# Patient Record
Sex: Male | Born: 1985 | Race: White | Hispanic: No | Marital: Single | Smoking: Current every day smoker
Health system: Southern US, Community
[De-identification: ages and names within clinical notes are randomized; demographics above are authoritative.]

## PROBLEM LIST (undated history)

## (undated) DIAGNOSIS — K859 Acute pancreatitis without necrosis or infection, unspecified: Secondary | ICD-10-CM

## (undated) DIAGNOSIS — F1010 Alcohol abuse, uncomplicated: Secondary | ICD-10-CM

## (undated) DIAGNOSIS — B1920 Unspecified viral hepatitis C without hepatic coma: Secondary | ICD-10-CM

## (undated) DIAGNOSIS — M67919 Unspecified disorder of synovium and tendon, unspecified shoulder: Secondary | ICD-10-CM

## (undated) DIAGNOSIS — Z720 Tobacco use: Secondary | ICD-10-CM

## (undated) HISTORY — PX: NO PAST SURGERIES: SHX2092

## (undated) HISTORY — PX: CHOLECYSTECTOMY: SHX55

## (undated) HISTORY — PX: WISDOM TOOTH EXTRACTION: SHX21

---

## 2009-04-17 ENCOUNTER — Emergency Department (HOSPITAL_COMMUNITY): Admission: EM | Admit: 2009-04-17 | Discharge: 2009-04-18 | Payer: Self-pay | Admitting: Emergency Medicine

## 2010-07-27 ENCOUNTER — Emergency Department (HOSPITAL_COMMUNITY)
Admission: EM | Admit: 2010-07-27 | Discharge: 2010-07-27 | Payer: Self-pay | Source: Home / Self Care | Admitting: Emergency Medicine

## 2010-10-31 LAB — BASIC METABOLIC PANEL
CO2: 26 mEq/L (ref 19–32)
Calcium: 9.6 mg/dL (ref 8.4–10.5)
Chloride: 102 mEq/L (ref 96–112)
Creatinine, Ser: 1.06 mg/dL (ref 0.4–1.5)
GFR calc Af Amer: >60 mL/min (ref >60)
Sodium: 137 mEq/L (ref 135–145)

## 2010-10-31 LAB — RAPID URINE DRUG SCREEN, HOSP PERFORMED
Amphetamines: NOT DETECTED
Barbiturates: NOT DETECTED
Benzodiazepines: POSITIVE — AB

## 2010-10-31 LAB — DIFFERENTIAL
Lymphs Abs: 3.4 10*3/uL (ref 0.7–4.0)
Monocytes Absolute: 0.9 10*3/uL (ref 0.1–1.0)
Monocytes Relative: 10 % (ref 3–12)
Neutro Abs: 5.1 10*3/uL (ref 1.7–7.7)
Neutrophils Relative %: 54 % (ref 43–77)

## 2010-10-31 LAB — CBC
Hemoglobin: 15.3 g/dL (ref 13.0–17.0)
RBC: 4.74 MIL/uL (ref 4.22–5.81)
WBC: 9.6 10*3/uL (ref 4.0–10.5)

## 2010-10-31 LAB — ETHANOL: Alcohol, Ethyl (B): <5 mg/dL (ref 0–10)

## 2010-12-14 ENCOUNTER — Emergency Department (HOSPITAL_COMMUNITY): Payer: Self-pay

## 2010-12-14 ENCOUNTER — Emergency Department (HOSPITAL_COMMUNITY)
Admission: EM | Admit: 2010-12-14 | Discharge: 2010-12-14 | Disposition: A | Discharge status: Home / Self Care | Payer: Self-pay | Attending: Emergency Medicine | Admitting: Emergency Medicine

## 2010-12-14 DIAGNOSIS — R110 Nausea: Secondary | ICD-10-CM | POA: Insufficient documentation

## 2010-12-14 DIAGNOSIS — R599 Enlarged lymph nodes, unspecified: Secondary | ICD-10-CM | POA: Insufficient documentation

## 2010-12-14 DIAGNOSIS — N509 Disorder of male genital organs, unspecified: Secondary | ICD-10-CM | POA: Insufficient documentation

## 2010-12-14 LAB — URINALYSIS, ROUTINE W REFLEX MICROSCOPIC
Bilirubin Urine: NEGATIVE
Glucose, UA: NEGATIVE mg/dL
Hgb urine dipstick: NEGATIVE
Specific Gravity, Urine: 1.013 (ref 1.005–1.030)
Urobilinogen, UA: 1 mg/dL (ref 0.0–1.0)
pH: 7 (ref 5.0–8.0)

## 2010-12-29 ENCOUNTER — Emergency Department (HOSPITAL_COMMUNITY)
Admission: EM | Admit: 2010-12-29 | Discharge: 2010-12-29 | Discharge status: Against Medical Advice | Payer: Self-pay | Attending: Emergency Medicine | Admitting: Emergency Medicine

## 2010-12-29 ENCOUNTER — Emergency Department (HOSPITAL_COMMUNITY): Payer: Self-pay

## 2010-12-29 DIAGNOSIS — Z0389 Encounter for observation for other suspected diseases and conditions ruled out: Secondary | ICD-10-CM | POA: Insufficient documentation

## 2011-06-24 ENCOUNTER — Encounter: Payer: Self-pay | Admitting: *Deleted

## 2011-06-24 ENCOUNTER — Emergency Department (HOSPITAL_COMMUNITY)
Admission: EM | Admit: 2011-06-24 | Discharge: 2011-06-24 | Disposition: A | Discharge status: Home / Self Care | Payer: Self-pay | Attending: Emergency Medicine | Admitting: Emergency Medicine

## 2011-06-24 DIAGNOSIS — F1210 Cannabis abuse, uncomplicated: Secondary | ICD-10-CM | POA: Insufficient documentation

## 2011-06-24 DIAGNOSIS — F17200 Nicotine dependence, unspecified, uncomplicated: Secondary | ICD-10-CM | POA: Insufficient documentation

## 2011-06-24 DIAGNOSIS — F1020 Alcohol dependence, uncomplicated: Secondary | ICD-10-CM | POA: Insufficient documentation

## 2011-06-24 DIAGNOSIS — R630 Anorexia: Secondary | ICD-10-CM | POA: Insufficient documentation

## 2011-06-24 DIAGNOSIS — R110 Nausea: Secondary | ICD-10-CM | POA: Insufficient documentation

## 2011-06-24 DIAGNOSIS — R10817 Generalized abdominal tenderness: Secondary | ICD-10-CM | POA: Insufficient documentation

## 2011-06-24 DIAGNOSIS — K029 Dental caries, unspecified: Secondary | ICD-10-CM | POA: Insufficient documentation

## 2011-06-24 HISTORY — DX: Alcohol abuse, uncomplicated: F10.10

## 2011-06-24 LAB — DIFFERENTIAL
Eosinophils Relative: 0 % (ref 0–5)
Lymphocytes Relative: 36 % (ref 12–46)
Lymphs Abs: 1.6 10*3/uL (ref 0.7–4.0)
Monocytes Relative: 12 % (ref 3–12)

## 2011-06-24 LAB — HEPATIC FUNCTION PANEL
Albumin: 4.9 g/dL (ref 3.5–5.2)
Bilirubin, Direct: 0.2 mg/dL (ref 0.0–0.3)
Total Bilirubin: 0.6 mg/dL (ref 0.3–1.2)

## 2011-06-24 LAB — CBC
HCT: 47.9 % (ref 39.0–52.0)
Hemoglobin: 16.8 g/dL (ref 13.0–17.0)
MCV: 94.3 fL (ref 78.0–100.0)
Platelets: 183 10*3/uL (ref 150–400)
RBC: 5.08 MIL/uL (ref 4.22–5.81)
WBC: 4.5 10*3/uL (ref 4.0–10.5)

## 2011-06-24 LAB — BASIC METABOLIC PANEL
BUN: 9 mg/dL (ref 6–23)
CO2: 23 mEq/L (ref 19–32)
Calcium: 9.8 mg/dL (ref 8.4–10.5)
Glucose, Bld: 83 mg/dL (ref 70–99)
Sodium: 138 mEq/L (ref 135–145)

## 2011-06-24 LAB — RAPID URINE DRUG SCREEN, HOSP PERFORMED
Amphetamines: NOT DETECTED
Cocaine: NOT DETECTED
Opiates: NOT DETECTED
Tetrahydrocannabinol: POSITIVE — AB

## 2011-06-24 MED ORDER — HYDROCODONE-ACETAMINOPHEN 5-325 MG PO TABS
1.0000 | ORAL_TABLET | Freq: Once | ORAL | Status: AC
  Administered 2011-06-24: 1 via ORAL
  Filled 2011-06-24: qty 1

## 2011-06-24 MED ORDER — LORAZEPAM 1 MG PO TABS
1.0000 mg | ORAL_TABLET | Freq: Once | ORAL | Status: AC
  Administered 2011-06-24: 1 mg via ORAL
  Filled 2011-06-24: qty 1

## 2011-06-24 MED ORDER — ACETAMINOPHEN 325 MG PO TABS
650.0000 mg | ORAL_TABLET | Freq: Once | ORAL | Status: AC
  Administered 2011-06-24: 650 mg via ORAL
  Filled 2011-06-24: qty 2

## 2011-06-24 NOTE — ED Provider Notes (Signed)
History     CSN: [id number] Arrival date & time: 06/24/2011  8:37 AM   First MD Initiated Contact with Patient 06/24/11 [phone]      Chief Complaint  Patient presents with  . Medical Clearance    (Consider location/radiation/quality/duration/timing/severity/associated sxs/prior treatment) HPI Comments: Patient comes to Ed requesting assistance for detox from alcohol.  States that he has hx of narcotic abuse and detoxed from narcotics and began drinking beer daily shortly after.  Admits to avg 24 cans of beer per day.  Occassional marijuana use but denies cocaine, or other street drug use at this time.  Reports his last alcohol consumption was 1:00 am this morning  drank two beers.  He denies previous in patient admission for alcohol detox. Also denies hx of DT's  Patient is a 25 y.o. male presenting with alcohol problem. The history is provided by the patient.  Alcohol Problem This is a chronic problem. The current episode started more than 1 year ago. The problem occurs constantly. The problem has been unchanged. Associated symptoms include anorexia and nausea. Pertinent negatives include no abdominal pain, arthralgias, chest pain, chills, coughing, fatigue, fever, headaches, joint swelling, myalgias, neck pain, numbness, rash, sore throat, swollen glands, urinary symptoms, vomiting or weakness. Associated symptoms comments: Dental pain. The symptoms are aggravated by nothing. He has tried nothing for the symptoms. The treatment provided no relief.    Past Medical History  Diagnosis Date  . Alcohol abuse     History reviewed. No pertinent past surgical history.  History reviewed. No pertinent family history.  History  Substance Use Topics  . Smoking status: Current Everyday Smoker -- 2.0 packs/day  . Smokeless tobacco: Not on file  . Alcohol Use: 0.0 oz/week    12-24 Cans of beer per week     daily      Review of Systems  Constitutional: Positive for appetite change. Negative  for fever, chills and fatigue.  HENT: Negative for sore throat, trouble swallowing, neck pain and neck stiffness.   Respiratory: Negative for cough, chest tightness, shortness of breath and wheezing.   Cardiovascular: Negative for chest pain and palpitations.  Gastrointestinal: Positive for nausea and anorexia. Negative for vomiting, abdominal pain, blood in stool and abdominal distention.  Genitourinary: Negative for dysuria, hematuria and flank pain.  Musculoskeletal: Negative for myalgias, back pain, joint swelling and arthralgias.  Skin: Negative.  Negative for rash.  Neurological: Negative for dizziness, tremors, weakness, numbness and headaches.  Hematological: Does not bruise/bleed easily.  All other systems reviewed and are negative.    Allergies  Review of patient's allergies indicates no known allergies.  Home Medications   Current Outpatient Rx  Name Route Sig Dispense Refill  . ACETAMINOPHEN 325 MG PO TABS Oral Take 650 mg by mouth every 6 (six) hours as needed. pain       BP 158/98  Pulse 109  Temp(Src) 98 F (36.7 C) (Oral)  Resp 18  Ht 5\' 11"  (1.803 m)  Wt 180 lb (81.647 kg)  BMI 25.10 kg/m2  SpO2 98%  Physical Exam  Nursing note and vitals reviewed. Constitutional: He is oriented to person, place, and time. He appears well-developed and well-nourished. No distress.       Appears anxious, smells of ETOH  HENT:  Head: Normocephalic and atraumatic. No trismus in the jaw.  Mouth/Throat: Uvula is midline, oropharynx is clear and moist and mucous membranes are normal. Dental caries present. No uvula swelling.  Eyes: EOM are normal. Pupils are equal,  round, and reactive to light.  Neck: Normal range of motion. Neck supple.  Cardiovascular: Normal rate, regular rhythm and normal heart sounds.   Pulmonary/Chest: Effort normal and breath sounds normal. No respiratory distress. He exhibits no tenderness.  Abdominal: Soft. Bowel sounds are normal. He exhibits no  distension and no mass. There is no hepatosplenomegaly. There is generalized tenderness. There is no rigidity, no rebound, no guarding and no CVA tenderness.  Musculoskeletal: Normal range of motion. He exhibits no edema and no tenderness.  Lymphadenopathy:    He has no cervical adenopathy.  Neurological: He is alert and oriented to person, place, and time. No cranial nerve deficit. He exhibits normal muscle tone. Coordination normal.  Skin: Skin is warm and dry.  Psychiatric: His speech is normal. Thought content normal. His mood appears anxious. He is not agitated, not actively hallucinating and not combative.    ED Course  Procedures (including critical care time)  Labs Reviewed  BASIC METABOLIC PANEL - Abnormal; Notable for the following:    Potassium 3.4 (*)    Chloride 95 (*)    All other components within normal limits  ETHANOL - Abnormal; Notable for the following:    Alcohol, Ethyl (B) 170 (*)    All other components within normal limits  URINE RAPID DRUG SCREEN (HOSP PERFORMED) - Abnormal; Notable for the following:    Tetrahydrocannabinol POSITIVE (*)    All other components within normal limits  HEPATIC FUNCTION PANEL - Abnormal; Notable for the following:    Total Protein 8.5 (*)    AST 123 (*)    ALT 162 (*)    All other components within normal limits  CBC  DIFFERENTIAL        MDM     0930 AM  Patient is alert, cooperative, appears anxious.  Smells of ETOH.  Frances Maywood of ACT team has been consulted will evaluate pt in the ED.    1130AM  ACT team counselor to arrange placement into a detox program.   2:41 PM counselor has arranged admission at [facility] in [place].  Pt to be transported by taxi    Maude Gloor L. Cecilee Rosner, [place] 06/24/11 1442

## 2011-06-24 NOTE — ED Notes (Signed)
Pt is here today to detox from alcohol. Pt states he has been drinking daily since May. Pt states he is now having generalized discomfort.
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
## 2011-06-24 NOTE — ED Provider Notes (Signed)
Medical screening examination/treatment/procedure(s) were performed by non-physician practitioner and as supervising physician I was immediately available for consultation/collaboration.   Benny Lennert, MD 06/24/11 1550

## 2011-06-27 DIAGNOSIS — K859 Acute pancreatitis without necrosis or infection, unspecified: Secondary | ICD-10-CM

## 2011-06-27 HISTORY — DX: Acute pancreatitis without necrosis or infection, unspecified: K85.90

## 2011-07-23 ENCOUNTER — Encounter (HOSPITAL_COMMUNITY): Payer: Self-pay | Admitting: Emergency Medicine

## 2011-07-23 ENCOUNTER — Inpatient Hospital Stay (HOSPITAL_COMMUNITY)
Admission: EM | Admit: 2011-07-23 | Discharge: 2011-07-28 | DRG: 440 | Disposition: A | Discharge status: Home / Self Care | Payer: Self-pay | Attending: Internal Medicine | Admitting: Internal Medicine

## 2011-07-23 ENCOUNTER — Emergency Department (HOSPITAL_COMMUNITY): Payer: Self-pay

## 2011-07-23 DIAGNOSIS — R000 Tachycardia, unspecified: Secondary | ICD-10-CM | POA: Diagnosis present

## 2011-07-23 DIAGNOSIS — F1010 Alcohol abuse, uncomplicated: Secondary | ICD-10-CM | POA: Diagnosis present

## 2011-07-23 DIAGNOSIS — D72829 Elevated white blood cell count, unspecified: Secondary | ICD-10-CM | POA: Diagnosis present

## 2011-07-23 DIAGNOSIS — F1020 Alcohol dependence, uncomplicated: Secondary | ICD-10-CM | POA: Diagnosis present

## 2011-07-23 DIAGNOSIS — R1110 Vomiting, unspecified: Secondary | ICD-10-CM | POA: Diagnosis present

## 2011-07-23 DIAGNOSIS — F17200 Nicotine dependence, unspecified, uncomplicated: Secondary | ICD-10-CM | POA: Diagnosis present

## 2011-07-23 DIAGNOSIS — R1013 Epigastric pain: Secondary | ICD-10-CM | POA: Diagnosis present

## 2011-07-23 DIAGNOSIS — E860 Dehydration: Secondary | ICD-10-CM | POA: Diagnosis present

## 2011-07-23 DIAGNOSIS — F1210 Cannabis abuse, uncomplicated: Secondary | ICD-10-CM | POA: Diagnosis present

## 2011-07-23 DIAGNOSIS — K859 Acute pancreatitis without necrosis or infection, unspecified: Principal | ICD-10-CM | POA: Diagnosis present

## 2011-07-23 HISTORY — DX: Tobacco use: Z72.0

## 2011-07-23 HISTORY — DX: Acute pancreatitis without necrosis or infection, unspecified: K85.90

## 2011-07-23 HISTORY — DX: Unspecified disorder of synovium and tendon, unspecified shoulder: M67.919

## 2011-07-23 LAB — COMPREHENSIVE METABOLIC PANEL
AST: 27 U/L (ref 0–37)
Alkaline Phosphatase: 51 U/L (ref 39–117)
BUN: 13 mg/dL (ref 6–23)
CO2: 24 mEq/L (ref 19–32)
Chloride: 97 mEq/L (ref 96–112)
Creatinine, Ser: 0.78 mg/dL (ref 0.50–1.35)
GFR calc non Af Amer: >90 mL/min (ref >90)
Potassium: 3.8 mEq/L (ref 3.5–5.1)
Total Bilirubin: 0.3 mg/dL (ref 0.3–1.2)

## 2011-07-23 LAB — URINE MICROSCOPIC-ADD ON

## 2011-07-23 LAB — URINALYSIS, ROUTINE W REFLEX MICROSCOPIC
Bilirubin Urine: NEGATIVE
Glucose, UA: NEGATIVE mg/dL
Ketones, ur: 15 mg/dL — AB
Leukocytes, UA: NEGATIVE
Protein, ur: 100 mg/dL — AB

## 2011-07-23 LAB — DIFFERENTIAL
Basophils Absolute: 0 10*3/uL (ref 0.0–0.1)
Lymphocytes Relative: 10 % — ABNORMAL LOW (ref 12–46)
Monocytes Absolute: 0.7 10*3/uL (ref 0.1–1.0)
Monocytes Relative: 5 % (ref 3–12)
Neutro Abs: 12.4 10*3/uL — ABNORMAL HIGH (ref 1.7–7.7)

## 2011-07-23 LAB — CBC
HCT: 46.2 % (ref 39.0–52.0)
Hemoglobin: 16.4 g/dL (ref 13.0–17.0)
RBC: 4.97 MIL/uL (ref 4.22–5.81)
WBC: 14.6 10*3/uL — ABNORMAL HIGH (ref 4.0–10.5)

## 2011-07-23 MED ORDER — HYDROMORPHONE HCL PF 1 MG/ML IJ SOLN
1.0000 mg | Freq: Once | INTRAMUSCULAR | Status: AC
  Administered 2011-07-23: 1 mg via INTRAVENOUS
  Filled 2011-07-23: qty 1

## 2011-07-23 MED ORDER — NICOTINE 21 MG/24HR TD PT24
21.0000 mg | MEDICATED_PATCH | Freq: Every day | TRANSDERMAL | Status: DC
  Administered 2011-07-24 – 2011-07-27 (×5): 21 mg via TRANSDERMAL
  Filled 2011-07-23 (×6): qty 1

## 2011-07-23 MED ORDER — LORAZEPAM 1 MG PO TABS
1.0000 mg | ORAL_TABLET | Freq: Four times a day (QID) | ORAL | Status: AC | PRN
  Administered 2011-07-23: 1 mg via ORAL
  Filled 2011-07-23: qty 1

## 2011-07-23 MED ORDER — FOLIC ACID 1 MG PO TABS
1.0000 mg | ORAL_TABLET | Freq: Every day | ORAL | Status: DC
  Administered 2011-07-23 – 2011-07-28 (×6): 1 mg via ORAL
  Filled 2011-07-23 (×6): qty 1

## 2011-07-23 MED ORDER — ONDANSETRON HCL 4 MG/2ML IJ SOLN
4.0000 mg | Freq: Once | INTRAMUSCULAR | Status: AC
  Administered 2011-07-23: 4 mg via INTRAVENOUS
  Filled 2011-07-23: qty 2

## 2011-07-23 MED ORDER — ONDANSETRON HCL 4 MG/2ML IJ SOLN
4.0000 mg | Freq: Four times a day (QID) | INTRAMUSCULAR | Status: DC
  Administered 2011-07-23: 4 mg via INTRAVENOUS
  Filled 2011-07-23: qty 2

## 2011-07-23 MED ORDER — HYDROMORPHONE HCL PF 1 MG/ML IJ SOLN
1.0000 mg | Freq: Once | INTRAMUSCULAR | Status: AC
Start: 2011-07-23 — End: 2011-07-23
  Administered 2011-07-23: 1 mg via INTRAVENOUS
  Filled 2011-07-23: qty 1

## 2011-07-23 MED ORDER — HYDROMORPHONE HCL PF 1 MG/ML IJ SOLN
1.0000 mg | INTRAMUSCULAR | Status: DC | PRN
  Filled 2011-07-23: qty 1

## 2011-07-23 MED ORDER — MORPHINE SULFATE 2 MG/ML IJ SOLN
2.0000 mg | INTRAMUSCULAR | Status: DC | PRN
  Filled 2011-07-23: qty 2

## 2011-07-23 MED ORDER — OXYCODONE HCL 5 MG PO TABS
5.0000 mg | ORAL_TABLET | ORAL | Status: DC | PRN
  Administered 2011-07-23 – 2011-07-28 (×26): 5 mg via ORAL
  Filled 2011-07-23 (×26): qty 1

## 2011-07-23 MED ORDER — FENTANYL CITRATE 0.05 MG/ML IJ SOLN
50.0000 ug | INTRAMUSCULAR | Status: AC
  Administered 2011-07-23: 50 ug via INTRAVENOUS
  Filled 2011-07-23: qty 2

## 2011-07-23 MED ORDER — ONDANSETRON HCL 4 MG PO TABS: 4.0000 mg | ORAL_TABLET | Freq: Four times a day (QID) | ORAL | Status: DC | PRN

## 2011-07-23 MED ORDER — LORAZEPAM 2 MG/ML IJ SOLN
1.0000 mg | Freq: Four times a day (QID) | INTRAMUSCULAR | Status: AC | PRN
  Administered 2011-07-25 (×3): 1 mg via INTRAVENOUS
  Administered 2011-07-26: 03:00:00 via INTRAVENOUS
  Filled 2011-07-23 (×4): qty 1

## 2011-07-23 MED ORDER — VITAMIN B-1 100 MG PO TABS
100.0000 mg | ORAL_TABLET | Freq: Every day | ORAL | Status: DC
  Administered 2011-07-23 – 2011-07-28 (×6): 100 mg via ORAL
  Filled 2011-07-23 (×6): qty 1

## 2011-07-23 MED ORDER — MORPHINE SULFATE 2 MG/ML IJ SOLN
2.0000 mg | INTRAMUSCULAR | Status: DC | PRN
  Administered 2011-07-23: 2 mg via INTRAVENOUS
  Administered 2011-07-23 (×2): 4 mg via INTRAVENOUS
  Administered 2011-07-23: 2 mg via INTRAVENOUS
  Administered 2011-07-23 – 2011-07-26 (×17): 4 mg via INTRAVENOUS
  Administered 2011-07-26: 2 mg via INTRAVENOUS
  Filled 2011-07-23 (×9): qty 2
  Filled 2011-07-23: qty 1
  Filled 2011-07-23 (×13): qty 2

## 2011-07-23 MED ORDER — ADULT MULTIVITAMIN W/MINERALS CH
1.0000 | ORAL_TABLET | Freq: Every day | ORAL | Status: DC
  Administered 2011-07-23 – 2011-07-28 (×6): 1 via ORAL
  Filled 2011-07-23 (×6): qty 1

## 2011-07-23 MED ORDER — SODIUM CHLORIDE 0.9 % IV SOLN
INTRAVENOUS | Status: DC
  Administered 2011-07-23 – 2011-07-26 (×6): via INTRAVENOUS

## 2011-07-23 MED ORDER — IOHEXOL 300 MG/ML  SOLN
100.0000 mL | Freq: Once | INTRAMUSCULAR | Status: AC | PRN
  Administered 2011-07-23: 100 mL via INTRAVENOUS

## 2011-07-23 MED ORDER — NICOTINE 21 MG/24HR TD PT24
21.0000 mg | MEDICATED_PATCH | Freq: Once | TRANSDERMAL | Status: AC
  Administered 2011-07-23: 21 mg via TRANSDERMAL
  Filled 2011-07-23: qty 1

## 2011-07-23 MED ORDER — SODIUM CHLORIDE 0.9 % IV SOLN
Freq: Once | INTRAVENOUS | Status: AC
  Administered 2011-07-23: 04:00:00 via INTRAVENOUS

## 2011-07-23 MED ORDER — ONDANSETRON HCL 4 MG/2ML IJ SOLN: 4.0000 mg | Freq: Four times a day (QID) | INTRAMUSCULAR | Status: DC | PRN

## 2011-07-23 MED ORDER — SODIUM CHLORIDE 0.9 % IV BOLUS (SEPSIS)
500.0000 mL | Freq: Once | INTRAVENOUS | Status: AC
  Administered 2011-07-23: 500 mL via INTRAVENOUS

## 2011-07-23 MED ORDER — THIAMINE HCL 100 MG/ML IJ SOLN
100.0000 mg | Freq: Every day | INTRAMUSCULAR | Status: DC
  Filled 2011-07-23 (×6): qty 1

## 2011-07-23 MED ORDER — MORPHINE SULFATE 4 MG/ML IJ SOLN
INTRAMUSCULAR | Status: AC
  Administered 2011-07-23: 4 mg
  Filled 2011-07-23: qty 1

## 2011-07-23 NOTE — Progress Notes (Signed)
Subjective: Chart reviewed. Patient complains of diffuse abdominal pain which is not significantly changed. Some back pain. No nausea or vomiting. Has tolerated sips of clears.  Objective: Blood pressure 157/93, pulse 73, temperature 98.5 F (36.9 C), temperature source Oral, resp. rate 20, height 5\' 11"  (1.803 m), weight 81.647 kg (180 lb), SpO2 97.00%.  Intake/Output Summary (Last 24 hours) at 07/23/11 1710 Last data filed at 07/23/11 1500  Gross per 24 hour  Intake   1000 ml  Output      0 ml  Net   1000 ml   General exam: Mild painful distress. Respiratory system: Clear. Cardiovascular system: first and second heart sounds heard, regular. Gastrointestinal system: Abdomen is nondistended, diffuse mild tenderness but soft. No rigidity guarding or rebound. Bowel sounds are normally heard. Central nervous system: Alert and oriented. No focal neurological deficits.  Lab Results: Basic Metabolic Panel:  [facility] 07/23/11 0210  NA 138  K 3.8  CL 97  CO2 24  GLUCOSE 111*  BUN 13  CREATININE 0.78  CALCIUM 9.6  MG --  PHOS --   Liver Function Tests:  Basename 07/23/11 0210  AST 27  ALT 41  ALKPHOS 51  BILITOT 0.3  PROT 8.3  ALBUMIN 4.8    Basename 07/23/11 0210  LIPASE 277*  AMYLASE --   No results found for this basename: AMMONIA:2 in the last 72 hours CBC:  Basename 07/23/11 0210  WBC 14.6*  NEUTROABS 12.4*  HGB 16.4  HCT 46.2  MCV 93.0  PLT 232   Urine Drug Screen: Drugs of Abuse     Component Value Date/Time   LABOPIA NONE DETECTED 06/24/2011 0912   COCAINSCRNUR NONE DETECTED 06/24/2011 0912   LABBENZ NONE DETECTED 06/24/2011 0912   AMPHETMU NONE DETECTED 06/24/2011 0912   THCU POSITIVE* 06/24/2011 0912   LABBARB NONE DETECTED 06/24/2011 0912    Alcohol Level: No results found for this basename: ETH:2 in the last 72 hours  Studies/Results: Ct Abdomen Pelvis W Contrast  07/23/2011  *RADIOLOGY REPORT*  Clinical Data: Mid abdominal pain,  elevated lipase and leukocytosis.  CT ABDOMEN AND PELVIS WITH CONTRAST    IMPRESSION:  1.  Suspect acute pancreatitis, with soft tissue inflammation about the head of the pancreas, and significant edema and fluid tracking inferiorly along the retroperitoneum and Gerota's fascia on the right side.  Edema extends to the level of the upper pelvis; the extent and distribution of edema is slightly unusual.  No evidence for pseudocyst formation or devascularization. 2.  No evidence for appendicitis.  Original Report Authenticated By: Tonia Ghent, M.D.    Medications: Scheduled Meds:   . sodium chloride   Intravenous Once  . fentaNYL  50 mcg Intravenous STAT  . folic acid  1 mg Oral Daily  .  HYDROmorphone (DILAUDID) injection  1 mg Intravenous Once  .  HYDROmorphone (DILAUDID) injection  1 mg Intravenous Once  .  HYDROmorphone (DILAUDID) injection  1 mg Intravenous Once  .  HYDROmorphone (DILAUDID) injection  1 mg Intravenous Once  . morphine      . mulitivitamin with minerals  1 tablet Oral Daily  . nicotine  21 mg Transdermal Once  . ondansetron (ZOFRAN) IV  4 mg Intravenous Once  . ondansetron (ZOFRAN) IV  4 mg Intravenous Once  . sodium chloride  500 mL Intravenous Once  . thiamine  100 mg Oral Daily   Or  . thiamine  100 mg Intravenous Daily  . DISCONTD: ondansetron (ZOFRAN) IV  4  mg Intravenous Q6H   Continuous Infusions:   . sodium chloride     PRN Meds:.iohexol, LORazepam, LORazepam, morphine, morphine injection, ondansetron (ZOFRAN) IV, ondansetron, oxyCODONE, DISCONTD:  HYDROmorphone (DILAUDID) injection  Assessment/Plan: 1. Acute pancreatitis, precipitated by alcohol intoxication: Continue clear liquids as tolerated and pain control. No indication for antibiotics at this time. 2. History of alcohol dependence: Has been through rehabilitation recently and had quit only to binge x1. No features of withdrawal. Will check CIWA. 3. Tobacco abuse 4. Polysubstance abuse: Marijuana:  Cessation counseled.   HONGALGI,ANAND 07/23/2011, 5:10 PM

## 2011-07-23 NOTE — H&P (Signed)
PCP:   No primary provider on file.   Doesn't have one  Chief Complaint:  Epigastric pain, nausea / vomiting  HPI: 25yoM with h/o narcotics abuse s/p detox, alcohol abuse s/p detox 06/2011, now comes back with  one night alcohol binge causing epigastric pain and vomiting, and found to have pancreatitis.   Pt was last seen in the ED 11/28 requesting detox from alcohol, stating that he had h/o  narcotics abuse and had detoxed from narcotics last year and started drinking beer instead,  averaging 24 cans per day, occasional marijuana use but denying other drugs. He was arranged to  be placed in a detox program, to [facility] in [place]. He did the detox and states he hasn't drank  alcohol since then until tonight, and things were actually going well such that he got a job at  [employer], and enrolled in college classes starting next semester. He moved in with his  mom as well, who he describes as a healthy "good [place] lady," as it appears that for most  of the year he was living with his dad, who he describes as a severe cirrhotic alcoholic who  doesn't eat for days and looks "like a skeleton" and he is suprised is still alive.   He abstained from alcohol until Christmas night when he went to his dad's and had drinks. He  drank about a fifth of liquor himself, but denies any other drug use. Subsequently develops  severe abdominal pain, nausea and vomiting, inability to tolerate water.   In the ED, vitals significant for tachycardia to 110. Chem panel normal, including renal  13/0.78, glucose 111. Lipase was 277, rest of LFT's normal. WBC was 14.6 with 85% neutros, rest  of CBC normal. UA showed 15 ketones, 100 protein, o/w normal. CTAP with contrast was done which  was suspect for pancreatitis, soft tissue inflammation around the head of pancreas, edema and  fluid tracking inferiorly along retroperitoneum and Gerota's fascia on the right, but nothing  more worrisome.      ROS as  above, o/w negative. Of note, he endorses h/o shakiness, palpitations, anxiety with alcohol withdrawal, but no seizures, hallucinations, comas, ICU admission.   Past Medical History  Diagnosis Date  . Alcohol abuse     Father is strongly alcoholic with cirrhosis. Rehab stay in 06/2011  . Rotator cuff disorder   . Pancreatitis 06/2011  . Tobacco abuse     History reviewed. No pertinent past surgical history.  Medications:  HOME MEDS:  Doesn't take any daily meds  Prior to Admission medications   Medication Sig Start Date End Date Taking? Authorizing Provider  acetaminophen (TYLENOL) 325 MG tablet Take 650 mg by mouth every 6 (six) hours as needed. pain    Yes Historical Provider, MD   Allergies:  No Known Allergies  Social History:   reports that he has been smoking.  He has quit using smokeless tobacco. He reports that he drinks alcohol. He reports that he uses illicit drugs (Marijuana). Living with his mom, in order to not live with his dad who he describes as daily severe drinker, with cirrhosis. Got a job at [employer] and enrolled at college after stopping drinking in rehab in 05/2011. Has insight into consequences of his alcohol abuse. Smokes 1/2-1 ppd for past 10 yrs, occasional marijuana use at parties, but denies any harder drugs, denies IVDU. Does endorse h/o shakiness and withdrawal symptoms in the past but no h/o seizures, ICU admissions for withdrawal.  Family History: Family History  Problem Relation Age of Onset  . Alcohol abuse    . Cirrhosis    . Healthy      Physical Exam: Filed Vitals:   07/23/11 0142  BP: 158/89  Pulse: 110  Temp: 98 F (36.7 C)  TempSrc: Oral  Resp: 16  Weight: 81.647 kg (180 lb)  SpO2: 99%   Blood pressure 158/89, pulse 110, temperature 98 F (36.7 C), temperature source Oral, resp. rate 16, weight 81.647 kg (180 lb), SpO2 99.00%. Gen: Young, normal sized, healthy appearing M in distress from abd pain, grimacing and    flinching, but still able to hold conversation well, good historian, no respiratory issues.  HEENT: PERRL, ~3-57mm, EOMI grossly intact, sclera/irises/conjunctivae normal appearing. Mouth  is moderately dry, no gross lesions Lungs: CTAB no w/c/r, normal exam Heart: RRR, not tachy, no m/g, normal exam Abd: Guarding quite a lot. Not obese, diffusely tender with facial grimacing to minimal  palpation. BS are hypoactive. Not distended Extrem: Warm, normal perfusion, normal exam with good bulk and tone. No BLE edema.  Neuro: Standing up using jug to urinate on arrival, gets back into bed on his own. Alert,  attentive, oriented, CN 2-12 intact grossly, moving extremities well, grossly all non-focal   Labs & Imaging Results for orders placed during the hospital encounter of 07/23/11 (from the past 48 hour(s))  CBC     Status: Abnormal   Collection Time   07/23/11  2:10 AM      Component Value Range Comment   WBC 14.6 (*) 4.0 - 10.5 (K/uL)    RBC 4.97  4.22 - 5.81 (MIL/uL)    Hemoglobin 16.4  13.0 - 17.0 (g/dL)    HCT 78.2  95.6 - 21.3 (%)    MCV 93.0  78.0 - 100.0 (fL)    MCH 33.0  26.0 - 34.0 (pg)    MCHC 35.5  30.0 - 36.0 (g/dL)    RDW 08.6  57.8 - 46.9 (%)    Platelets 232  150 - 400 (K/uL)   DIFFERENTIAL     Status: Abnormal   Collection Time   07/23/11  2:10 AM      Component Value Range Comment   Neutrophils Relative 85 (*) 43 - 77 (%)    Neutro Abs 12.4 (*) 1.7 - 7.7 (K/uL)    Lymphocytes Relative 10 (*) 12 - 46 (%)    Lymphs Abs 1.5  0.7 - 4.0 (K/uL)    Monocytes Relative 5  3 - 12 (%)    Monocytes Absolute 0.7  0.1 - 1.0 (K/uL)    Eosinophils Relative 0  0 - 5 (%)    Eosinophils Absolute 0.0  0.0 - 0.7 (K/uL)    Basophils Relative 0  0 - 1 (%)    Basophils Absolute 0.0  0.0 - 0.1 (K/uL)   COMPREHENSIVE METABOLIC PANEL     Status: Abnormal   Collection Time   07/23/11  2:10 AM      Component Value Range Comment   Sodium 138  135 - 145 (mEq/L)    Potassium 3.8  3.5 - 5.1  (mEq/L)    Chloride 97  96 - 112 (mEq/L)    CO2 24  19 - 32 (mEq/L)    Glucose, Bld 111 (*) 70 - 99 (mg/dL)    BUN 13  6 - 23 (mg/dL)    Creatinine, Ser 6.29  0.50 - 1.35 (mg/dL)    Calcium 9.6  8.4 -  10.5 (mg/dL)    Total Protein 8.3  6.0 - 8.3 (g/dL)    Albumin 4.8  3.5 - 5.2 (g/dL)    AST 27  0 - 37 (U/L)    ALT 41  0 - 53 (U/L)    Alkaline Phosphatase 51  39 - 117 (U/L)    Total Bilirubin 0.3  0.3 - 1.2 (mg/dL)    GFR calc non Af Amer >90  >90 (mL/min)    GFR calc Af Amer >90  >90 (mL/min)   LIPASE, BLOOD     Status: Abnormal   Collection Time   07/23/11  2:10 AM      Component Value Range Comment   Lipase 277 (*) 11 - 59 (U/L)   URINALYSIS, ROUTINE W REFLEX MICROSCOPIC     Status: Abnormal   Collection Time   07/23/11  3:07 AM      Component Value Range Comment   Color, Urine YELLOW  YELLOW     APPearance CLEAR  CLEAR     Specific Gravity, Urine 1.028  1.005 - 1.030     pH 7.0  5.0 - 8.0     Glucose, UA NEGATIVE  NEGATIVE (mg/dL)    Hgb urine dipstick NEGATIVE  NEGATIVE     Bilirubin Urine NEGATIVE  NEGATIVE     Ketones, ur 15 (*) NEGATIVE (mg/dL)    Protein, ur 161 (*) NEGATIVE (mg/dL)    Urobilinogen, UA 0.2  0.0 - 1.0 (mg/dL)    Nitrite NEGATIVE  NEGATIVE     Leukocytes, UA NEGATIVE  NEGATIVE    URINE MICROSCOPIC-ADD ON     Status: Normal   Collection Time   07/23/11  3:07 AM      Component Value Range Comment   Squamous Epithelial / LPF RARE  RARE     WBC, UA 0-2  <3 (WBC/hpf)    RBC / HPF 0-2  <3 (RBC/hpf)    Urine-Other MUCOUS PRESENT      Ct Abdomen Pelvis W Contrast  07/23/2011  *RADIOLOGY REPORT*  Clinical Data: Mid abdominal pain, elevated lipase and leukocytosis.  CT ABDOMEN AND PELVIS WITH CONTRAST  Technique:  Multidetector CT imaging of the abdomen and pelvis was performed following the standard protocol during bolus administration of intravenous contrast.  Contrast: OMNIPAQUE IOHEXOL 300 MG/ML IV SOLN  Comparison: None.  Findings: Minimal right  basilar atelectasis is noted.  There is soft tissue inflammation about the head of the pancreas, with significant edema and fluid tracking inferiorly from the pancreatic head along the retroperitoneum and Gerota's fascia on the right side.  This extends to the level of the upper pelvis. The extent and distribution of soft tissue edema is slightly unusual, but raises concern for pancreatitis.  There is no evidence for pseudocyst formation or devascularization at this time.  The remainder of the pancreas is unremarkable in appearance.  The liver and spleen are unremarkable in appearance.  The gallbladder is within normal limits.  The adrenal glands are normal in appearance.  The kidneys are unremarkable in appearance.  There is no evidence of hydronephrosis.  No renal or ureteral stones are seen.  No perinephric stranding is appreciated.  No free fluid is identified.  The small bowel is unremarkable in appearance.  The stomach is within normal limits.  No acute vascular abnormalities are seen.  The appendix contains air, with minimal adjacent stranding; this likely remains within normal limits, without definite evidence for appendicitis.  Mild colonic wall thickening adjacent to  the head of the pancreas is likely reactive in nature.  The colon is largely filled with stool; a single diverticulum is noted along the splenic flexure of the colon.  The bladder is mildly distended and grossly unremarkable in appearance.  The prostate remains normal in size.  No inguinal lymphadenopathy is seen.  No acute osseous abnormalities are identified.  IMPRESSION:  1.  Suspect acute pancreatitis, with soft tissue inflammation about the head of the pancreas, and significant edema and fluid tracking inferiorly along the retroperitoneum and Gerota's fascia on the right side.  Edema extends to the level of the upper pelvis; the extent and distribution of edema is slightly unusual.  No evidence for pseudocyst formation or  devascularization. 2.  No evidence for appendicitis.  Original Report Authenticated By: Tonia Ghent, M.D.    Impression Present on Admission:  .Alcohol abuse .Pancreatitis .Tachycardia   25yoM with h/o narcotics abuse s/p detox, alcohol abuse s/p detox 06/2011, now comes back with  one night alcohol binge causing epigastric pain and vomiting, and found to have pancreatitis.  1. Pancreatitis, alcoholic: No evidence of gallstone on CT scan. Most likely due to alcohol binge  over Christmas. Pt does appear to have good insight and expresses that he never wants to drink  again with the amt of pain he's in currently. I spoke with him quite a bit about avoiding  alcohol completely, his high risk of alcoholism given father's history, and the risks and  misery of chronic pancreatitis if he keeps drinking.   Although we don't have LDH, his other Ranson's criteria are negative, except that WBC's are  approaching 16k.   - IVF's, pain control, ice chips and can advance to clear liquids as tolerated, CIWA scale and  have counseled extensively.   2. SW consult, pt questions if financial assistance for this admission is available.   Regular bed, [facility] team 1 Presumed full code  Other plans as per orders.  Labib Cwynar 07/23/2011, 6:37 AM

## 2011-07-23 NOTE — ED Notes (Signed)
Pt alert, nad, c/o left upper quad abd pain, onset this evening, c/o nausea with emesis, denies recent ill contact or exposures

## 2011-07-23 NOTE — ED Provider Notes (Signed)
History     CSN: [id number]  Arrival date & time 07/23/11  0135   First MD Initiated Contact with Patient 07/23/11 0241      Chief Complaint  Patient presents with  . Abdominal Pain  . Nausea     HPI  History provider the patient. Patient is a 25 year old male with no significant past medical history who presents with complaints of upper abdominal pains have gradually increased since 5 PM yesterday. Pain has been sharp at times but is mostly dull with a pressure. Pain has been associated with nausea and vomiting. Patient denies similar symptoms previously. Patient admits to alcohol use on Christmas night but states pains are different from pain or feelings. Patient has not eaten since pain began but does report drinking fluids with no change in symptoms. Patient denies any diarrhea, constipation,fever, chills, or sweats. Patient has no other significant past medical history   Past Medical History  Diagnosis Date  . Alcohol abuse     History reviewed. No pertinent past surgical history.  No family history on file.  History  Substance Use Topics  . Smoking status: Current Everyday Smoker -- 2.0 packs/day  . Smokeless tobacco: Not on file  . Alcohol Use: 0.0 oz/week    12-24 Cans of beer per week     daily      Review of Systems  Constitutional: Negative for fever and chills.  Respiratory: Negative for cough and shortness of breath.   Gastrointestinal: Positive for nausea, vomiting and abdominal pain. Negative for diarrhea and constipation.  Genitourinary: Negative for dysuria, hematuria and flank pain.  All other systems reviewed and are negative.    Allergies  Review of patient's allergies indicates no known allergies.  Home Medications   Current Outpatient Rx  Name Route Sig Dispense Refill  . ACETAMINOPHEN 325 MG PO TABS Oral Take 650 mg by mouth every 6 (six) hours as needed. pain       BP 158/89  Pulse 110  Temp(Src) 98 F (36.7 C) (Oral)  Resp 16   Wt 180 lb (81.647 kg)  SpO2 99%  Physical Exam  Nursing note and vitals reviewed. Constitutional: He is oriented to person, place, and time. He appears well-developed and well-nourished.  HENT:  Head: Normocephalic and atraumatic.  Cardiovascular: Regular rhythm and normal heart sounds.  Tachycardia present.   Pulmonary/Chest: Effort normal and breath sounds normal. He has no wheezes. He has no rales.  Abdominal: Soft. Bowel sounds are normal. There is tenderness. There is guarding. There is no rigidity, no rebound, no CVA tenderness, no tenderness at McBurney's point and negative Murphy's sign.       Diffuse abdominal tenderness.  Neurological: He is alert and oriented to person, place, and time.  Skin: Skin is warm. No rash noted.  Psychiatric: He has a normal mood and affect. His behavior is normal.    ED Course  Procedures (including critical care time)   Labs Reviewed  CBC  DIFFERENTIAL  COMPREHENSIVE METABOLIC PANEL  LIPASE, BLOOD  URINALYSIS, ROUTINE W REFLEX MICROSCOPIC   Results for orders placed during the hospital encounter of 07/23/11  CBC      Component Value Range   WBC 14.6 (*) 4.0 - 10.5 (K/uL)   RBC 4.97  4.22 - 5.81 (MIL/uL)   Hemoglobin 16.4  13.0 - 17.0 (g/dL)   HCT 14.7  82.9 - 56.2 (%)   MCV 93.0  78.0 - 100.0 (fL)   MCH 33.0  26.0 - 34.0 (pg)  MCHC 35.5  30.0 - 36.0 (g/dL)   RDW 04.5  40.9 - 81.1 (%)   Platelets 232  150 - 400 (K/uL)  DIFFERENTIAL      Component Value Range   Neutrophils Relative 85 (*) 43 - 77 (%)   Neutro Abs 12.4 (*) 1.7 - 7.7 (K/uL)   Lymphocytes Relative 10 (*) 12 - 46 (%)   Lymphs Abs 1.5  0.7 - 4.0 (K/uL)   Monocytes Relative 5  3 - 12 (%)   Monocytes Absolute 0.7  0.1 - 1.0 (K/uL)   Eosinophils Relative 0  0 - 5 (%)   Eosinophils Absolute 0.0  0.0 - 0.7 (K/uL)   Basophils Relative 0  0 - 1 (%)   Basophils Absolute 0.0  0.0 - 0.1 (K/uL)  COMPREHENSIVE METABOLIC PANEL      Component Value Range   Sodium 138  135 -  145 (mEq/L)   Potassium 3.8  3.5 - 5.1 (mEq/L)   Chloride 97  96 - 112 (mEq/L)   CO2 24  19 - 32 (mEq/L)   Glucose, Bld 111 (*) 70 - 99 (mg/dL)   BUN 13  6 - 23 (mg/dL)   Creatinine, Ser 9.14  0.50 - 1.35 (mg/dL)   Calcium 9.6  8.4 - 78.2 (mg/dL)   Total Protein 8.3  6.0 - 8.3 (g/dL)   Albumin 4.8  3.5 - 5.2 (g/dL)   AST 27  0 - 37 (U/L)   ALT 41  0 - 53 (U/L)   Alkaline Phosphatase 51  39 - 117 (U/L)   Total Bilirubin 0.3  0.3 - 1.2 (mg/dL)   GFR calc non Af Amer >90  >90 (mL/min)   GFR calc Af Amer >90  >90 (mL/min)  LIPASE, BLOOD      Component Value Range   Lipase 277 (*) 11 - 59 (U/L)  URINALYSIS, ROUTINE W REFLEX MICROSCOPIC      Component Value Range   Color, Urine YELLOW  YELLOW    APPearance CLEAR  CLEAR    Specific Gravity, Urine 1.028  1.005 - 1.030    pH 7.0  5.0 - 8.0    Glucose, UA NEGATIVE  NEGATIVE (mg/dL)   Hgb urine dipstick NEGATIVE  NEGATIVE    Bilirubin Urine NEGATIVE  NEGATIVE    Ketones, ur 15 (*) NEGATIVE (mg/dL)   Protein, ur 956 (*) NEGATIVE (mg/dL)   Urobilinogen, UA 0.2  0.0 - 1.0 (mg/dL)   Nitrite NEGATIVE  NEGATIVE    Leukocytes, UA NEGATIVE  NEGATIVE   URINE MICROSCOPIC-ADD ON      Component Value Range   Squamous Epithelial / LPF RARE  RARE    WBC, UA 0-2  <3 (WBC/hpf)   RBC / HPF 0-2  <3 (RBC/hpf)   Urine-Other MUCOUS PRESENT        Ct Abdomen Pelvis W Contrast  07/23/2011  *RADIOLOGY REPORT*  Clinical Data: Mid abdominal pain, elevated lipase and leukocytosis.  CT ABDOMEN AND PELVIS WITH CONTRAST  Technique:  Multidetector CT imaging of the abdomen and pelvis was performed following the standard protocol during bolus administration of intravenous contrast.  Contrast: OMNIPAQUE IOHEXOL 300 MG/ML IV SOLN  Comparison: None.  Findings: Minimal right basilar atelectasis is noted.  There is soft tissue inflammation about the head of the pancreas, with significant edema and fluid tracking inferiorly from the pancreatic head along the  retroperitoneum and Gerota's fascia on the right side.  This extends to the level of the upper pelvis.  The extent and distribution of soft tissue edema is slightly unusual, but raises concern for pancreatitis.  There is no evidence for pseudocyst formation or devascularization at this time.  The remainder of the pancreas is unremarkable in appearance.  The liver and spleen are unremarkable in appearance.  The gallbladder is within normal limits.  The adrenal glands are normal in appearance.  The kidneys are unremarkable in appearance.  There is no evidence of hydronephrosis.  No renal or ureteral stones are seen.  No perinephric stranding is appreciated.  No free fluid is identified.  The small bowel is unremarkable in appearance.  The stomach is within normal limits.  No acute vascular abnormalities are seen.  The appendix contains air, with minimal adjacent stranding; this likely remains within normal limits, without definite evidence for appendicitis.  Mild colonic wall thickening adjacent to the head of the pancreas is likely reactive in nature.  The colon is largely filled with stool; a single diverticulum is noted along the splenic flexure of the colon.  The bladder is mildly distended and grossly unremarkable in appearance.  The prostate remains normal in size.  No inguinal lymphadenopathy is seen.  No acute osseous abnormalities are identified.  IMPRESSION:  1.  Suspect acute pancreatitis, with soft tissue inflammation about the head of the pancreas, and significant edema and fluid tracking inferiorly along the retroperitoneum and Gerota's fascia on the right side.  Edema extends to the level of the upper pelvis; the extent and distribution of edema is slightly unusual.  No evidence for pseudocyst formation or devascularization. 2.  No evidence for appendicitis.  Original Report Authenticated By: Tonia Ghent, M.D.     1. Acute pancreatitis       MDM  2:50 a.m. patient seen and evaluated. Patient  in no acute distress.  Patient having continued pain. But no episodes of nausea vomiting.  Patient's lipase very elevated and CT scan shows inflammation around pancreas. There no signs for gallstones or dilated common bile duct on CT. Patient has other normal LFTs.  Spoke with [facility] hospitalist they will see patient and admit to Team 1 on a general med surge bed.          Angus Seller, [place] 07/23/11 [phone]

## 2011-07-23 NOTE — Progress Notes (Signed)
CSW acknowledged order for financial assistance, patient states he had questions about his hospital bill due to the fact that he has no insurance. CSW left message for Jasmine December (#: [phone]) financial counselor. CSW signing off.   Unice Bailey, LCSWA [phone]

## 2011-07-23 NOTE — ED Provider Notes (Signed)
Medical screening examination/treatment/procedure(s) were performed by non-physician practitioner and as supervising physician I was immediately available for consultation/collaboration.   Dione Booze, MD 07/23/11 [phone]

## 2011-07-24 LAB — CBC
MCH: 32.4 pg (ref 26.0–34.0)
MCHC: 34.6 g/dL (ref 30.0–36.0)
MCV: 93.6 fL (ref 78.0–100.0)
Platelets: 164 10*3/uL (ref 150–400)
RBC: 4.72 MIL/uL (ref 4.22–5.81)
RDW: 12.9 % (ref 11.5–15.5)

## 2011-07-24 LAB — BASIC METABOLIC PANEL
CO2: 28 mEq/L (ref 19–32)
Calcium: 9.1 mg/dL (ref 8.4–10.5)
Creatinine, Ser: 0.85 mg/dL (ref 0.50–1.35)
GFR calc non Af Amer: >90 mL/min (ref >90)
Glucose, Bld: 99 mg/dL (ref 70–99)
Sodium: 134 mEq/L — ABNORMAL LOW (ref 135–145)

## 2011-07-24 MED ORDER — ZOLPIDEM TARTRATE 5 MG PO TABS
5.0000 mg | ORAL_TABLET | Freq: Every evening | ORAL | Status: DC | PRN
  Administered 2011-07-24 – 2011-07-27 (×3): 5 mg via ORAL
  Filled 2011-07-24 (×3): qty 1

## 2011-07-24 NOTE — Progress Notes (Signed)
Summary 25 year old male with history of narcotic abuse and alcohol abuse status post detox in early December 2012, tobacco abuse came in with abdominal pain, nausea and vomiting after an alcohol binge on Christmas night. CT abdomen and his lipase were suggestive of acute pancreatitis. Patient is slowly improving.   Subjective: Patient says abdominal pain is slowly improving and rates it as 6-7/10. Complaints of difficulty sleeping at night and requests sleeping medication. Tolerating small amount of clear liquids. Passing flatus. No BM. Ambulating in room.   Objective: Blood pressure 139/80, pulse 84, temperature 99.5 F (37.5 C), temperature source Oral, resp. rate 18, height 5\' 11"  (1.803 m), weight 81.647 kg (180 lb), SpO2 97.00%.  Intake/Output Summary (Last 24 hours) at 07/24/11 1133 Last data filed at 07/24/11 0700  Gross per 24 hour  Intake 2644.17 ml  Output   2275 ml  Net 369.17 ml   General exam: Comfortable. Looks much better than he did yesterday. Respiratory system: Clear. Cardiovascular system: first and second heart sounds heard, regular. Telemetry shows sinus rhythm with occasional PACs. Gastrointestinal system: Abdomen is nondistended, diffuse mild tenderness but soft.? Guarding in the lower quadrants but no rigidity or rebound. Bowel sounds are normally heard. Central nervous system: Alert and oriented. No focal neurological deficits.  Lab Results: Basic Metabolic Panel:  Basename 07/24/11 0531 07/23/11 0210  NA 134* 138  K 3.5 3.8  CL 98 97  CO2 28 24  GLUCOSE 99 111*  BUN 5* 13  CREATININE 0.85 0.78  CALCIUM 9.1 9.6  MG -- --  PHOS -- --   Liver Function Tests:  [facility] 07/23/11 0210  AST 27  ALT 41  ALKPHOS 51  BILITOT 0.3  PROT 8.3  ALBUMIN 4.8    Basename 07/23/11 0210  LIPASE 277*  AMYLASE --   No results found for this basename: AMMONIA:2 in the last 72 hours CBC:  Basename 07/24/11 0531 07/23/11 0210  WBC 11.5* 14.6*  NEUTROABS --  12.4*  HGB 15.3 16.4  HCT 44.2 46.2  MCV 93.6 93.0  PLT 164 232   Urine Drug Screen: Drugs of Abuse     Component Value Date/Time   LABOPIA NONE DETECTED 06/24/2011 0912   COCAINSCRNUR NONE DETECTED 06/24/2011 0912   LABBENZ NONE DETECTED 06/24/2011 0912   AMPHETMU NONE DETECTED 06/24/2011 0912   THCU POSITIVE* 06/24/2011 0912   LABBARB NONE DETECTED 06/24/2011 0912    Alcohol Level: No results found for this basename: ETH:2 in the last 72 hours  Studies/Results: Ct Abdomen Pelvis W Contrast  07/23/2011  *RADIOLOGY REPORT*  Clinical Data: Mid abdominal pain, elevated lipase and leukocytosis.  CT ABDOMEN AND PELVIS WITH CONTRAST    IMPRESSION:  1.  Suspect acute pancreatitis, with soft tissue inflammation about the head of the pancreas, and significant edema and fluid tracking inferiorly along the retroperitoneum and Gerota's fascia on the right side.  Edema extends to the level of the upper pelvis; the extent and distribution of edema is slightly unusual.  No evidence for pseudocyst formation or devascularization. 2.  No evidence for appendicitis.  Original Report Authenticated By: Tonia Ghent, M.D.    Medications: Scheduled Meds:    . folic acid  1 mg Oral Daily  . mulitivitamin with minerals  1 tablet Oral Daily  . nicotine  21 mg Transdermal Once  . nicotine  21 mg Transdermal Daily  . thiamine  100 mg Oral Daily   Or  . thiamine  100 mg Intravenous Daily   Continuous  Infusions:    . sodium chloride 125 mL/hr at 07/24/11 0704   PRN Meds:.LORazepam, LORazepam, morphine injection, ondansetron (ZOFRAN) IV, ondansetron, oxyCODONE, zolpidem, DISCONTD: morphine  Assessment/Plan: 1. Acute pancreatitis, precipitated by alcohol intoxication: Improving. Continue clear liquids as tolerated and pain control. No indication for antibiotics at this time. If he continues to improve then will try advancing diet tomorrow. 2. History of alcohol dependence: Has been through  rehabilitation recently and had quit only to binge x1. No features of withdrawal. Will check CIWA. 3. Tobacco abuse 4. Polysubstance abuse: Marijuana: Cessation counseled. 5. Leukocytosis: Improved  Disposition: Possible discharge in 48 hours.   Aune Adami 07/24/2011, 11:33 AM

## 2011-07-24 NOTE — Progress Notes (Signed)
Spoke with patient at bedside regarding f/u care. Patient currently has no insurance and no PCP. Agree to [facility] appt with Dr. Clelia Croft, eligibility appt made, [facility] referral made. Patient inquiring about affordable insurance, referred him to [facility] if they had a group policy as he is currently a full time Consulting civil engineer. Financial counselor to see as well. States is currently working and able to afford meds on $3/4 list.

## 2011-07-24 NOTE — Progress Notes (Signed)
UR completed 

## 2011-07-25 LAB — BASIC METABOLIC PANEL
BUN: 6 mg/dL (ref 6–23)
Chloride: 100 mEq/L (ref 96–112)
GFR calc Af Amer: >90 mL/min (ref >90)
GFR calc non Af Amer: >90 mL/min (ref >90)
Potassium: 4 mEq/L (ref 3.5–5.1)
Sodium: 139 mEq/L (ref 135–145)

## 2011-07-25 LAB — LIPASE, BLOOD: Lipase: 20 U/L (ref 11–59)

## 2011-07-25 LAB — CBC
HCT: 44.5 % (ref 39.0–52.0)
MCHC: 34.2 g/dL (ref 30.0–36.0)
Platelets: 172 10*3/uL (ref 150–400)
RDW: 12.6 % (ref 11.5–15.5)
WBC: 9 10*3/uL (ref 4.0–10.5)

## 2011-07-25 NOTE — Progress Notes (Signed)
Summary 25 year old male with history of narcotic abuse and alcohol abuse status post detox in early December 2012, tobacco abuse came in with abdominal pain, nausea and vomiting after an alcohol binge on Christmas night. CT abdomen and his lipase were suggestive of acute pancreatitis. Patient was slowly improving but today says that since last night his abdominal pain has worsened. It's difficult to objectively evaluate his pain. Discussed with [place] [facility] who will see him tomorrow. They however did not think that repeating a CAT scan within 48 hours would reveal anything new. Will consider re\re imaging if he has worsening of his abdominal pain, leukocytosis or develops a fever.   Subjective:  patient says that his abdominal pain was improving until last night but since then it has increased. However it is difficult to truly assess objectively how much pain he has.   Objective: Blood pressure 134/79, pulse 83, temperature 98.4 F (36.9 C), temperature source Oral, resp. rate 18, height 5\' 11"  (1.803 m), weight 81.647 kg (180 lb), SpO2 100.00%.  Intake/Output Summary (Last 24 hours) at 07/25/11 1819 Last data filed at 07/25/11 1300  Gross per 24 hour  Intake 2509.7 ml  Output   2125 ml  Net  384.7 ml   General exam: Comfortable. Looks much better than he did yesterday. Respiratory system: Clear. Cardiovascular system: first and second heart sounds heard, regular. Telemetry shows sinus rhythm with occasional PACs. Gastrointestinal system: Abdomen is non distended. When he is distracted and abdomen is examined there is no tenderness and abdomen is soft. Bowel sounds are normally heard. Central nervous system: Alert and oriented. No focal neurological deficits.  Lab Results: Basic Metabolic Panel:  Basename 07/25/11 0640 07/24/11 0531  NA 139 134*  K 4.0 3.5  CL 100 98  CO2 30 28  GLUCOSE 88 99  BUN 6 5*  CREATININE 0.91 0.85  CALCIUM 9.6 9.1  MG -- --  PHOS -- --    Liver Function Tests:  [facility] 07/23/11 0210  AST 27  ALT 41  ALKPHOS 51  BILITOT 0.3  PROT 8.3  ALBUMIN 4.8    Basename 07/25/11 0640 07/23/11 0210  LIPASE 20 277*  AMYLASE -- --   No results found for this basename: AMMONIA:2 in the last 72 hours CBC:  Basename 07/25/11 0640 07/24/11 0531 07/23/11 0210  WBC 9.0 11.5* --  NEUTROABS -- -- 12.4*  HGB 15.2 15.3 --  HCT 44.5 44.2 --  MCV 94.1 93.6 --  PLT 172 164 --   Urine Drug Screen: Drugs of Abuse     Component Value Date/Time   LABOPIA NONE DETECTED 06/24/2011 0912   COCAINSCRNUR NONE DETECTED 06/24/2011 0912   LABBENZ NONE DETECTED 06/24/2011 0912   AMPHETMU NONE DETECTED 06/24/2011 0912   THCU POSITIVE* 06/24/2011 0912   LABBARB NONE DETECTED 06/24/2011 0912    Alcohol Level: No results found for this basename: ETH:2 in the last 72 hours  Studies/Results: Ct Abdomen Pelvis W Contrast  07/23/2011  *RADIOLOGY REPORT*  Clinical Data: Mid abdominal pain, elevated lipase and leukocytosis.  CT ABDOMEN AND PELVIS WITH CONTRAST    IMPRESSION:  1.  Suspect acute pancreatitis, with soft tissue inflammation about the head of the pancreas, and significant edema and fluid tracking inferiorly along the retroperitoneum and Gerota's fascia on the right side.  Edema extends to the level of the upper pelvis; the extent and distribution of edema is slightly unusual.  No evidence for pseudocyst formation or devascularization. 2.  No evidence for appendicitis.  Original Report Authenticated By: Tonia Ghent, M.D.    Medications: Scheduled Meds:    . folic acid  1 mg Oral Daily  . mulitivitamin with minerals  1 tablet Oral Daily  . nicotine  21 mg Transdermal Daily  . thiamine  100 mg Oral Daily   Or  . thiamine  100 mg Intravenous Daily   Continuous Infusions:    . sodium chloride 75 mL/hr at 07/25/11 1804   PRN Meds:.LORazepam, LORazepam, morphine injection, ondansetron (ZOFRAN) IV, ondansetron, oxyCODONE,  zolpidem  Assessment/Plan: 1. Acute pancreatitis, possibly precipitated by alcohol intoxication:  we'll make him n.p.o. Continue IV fluids and current pain medications. If he has increasing pain or fever or leukocytosis then consider repeat CT scan of his abdomen. [facility] will see him tomorrow.  2. History of alcohol dependence: No features of withdrawal.  3. Tobacco abuse 4. Polysubstance abuse: Marijuana: Cessation counseled. 5. Leukocytosis: Improved  Disposition:  not ready for discharge yet.    Cali Cuartas 07/25/2011, 6:19 PM

## 2011-07-25 NOTE — Plan of Care (Signed)
Problem: Phase I Progression Outcomes Goal: Pain controlled with appropriate interventions Outcome: Not Progressing Requiring the maximum amount of narcotics allowed per his orders

## 2011-07-26 DIAGNOSIS — K859 Acute pancreatitis without necrosis or infection, unspecified: Secondary | ICD-10-CM

## 2011-07-26 DIAGNOSIS — R933 Abnormal findings on diagnostic imaging of other parts of digestive tract: Secondary | ICD-10-CM

## 2011-07-26 DIAGNOSIS — R1013 Epigastric pain: Secondary | ICD-10-CM

## 2011-07-26 LAB — CBC
Hemoglobin: 14.4 g/dL (ref 13.0–17.0)
MCH: 31 pg (ref 26.0–34.0)
Platelets: 172 10*3/uL (ref 150–400)
RBC: 4.65 MIL/uL (ref 4.22–5.81)

## 2011-07-26 MED ORDER — SODIUM CHLORIDE 0.9 % IJ SOLN: 3.0000 mL | INTRAMUSCULAR | Status: DC | PRN

## 2011-07-26 MED ORDER — MORPHINE SULFATE CR 15 MG PO TB12
15.0000 mg | ORAL_TABLET | Freq: Two times a day (BID) | ORAL | Status: DC
  Administered 2011-07-26 – 2011-07-28 (×5): 15 mg via ORAL
  Filled 2011-07-26 (×5): qty 1

## 2011-07-26 MED ORDER — MORPHINE SULFATE 2 MG/ML IJ SOLN
2.0000 mg | Freq: Once | INTRAMUSCULAR | Status: AC
  Administered 2011-07-26: 4 mg via INTRAVENOUS

## 2011-07-26 MED ORDER — SODIUM CHLORIDE 0.9 % IV SOLN: 250.0000 mL | INTRAVENOUS | Status: DC | PRN

## 2011-07-26 MED ORDER — SODIUM CHLORIDE 0.9 % IJ SOLN
3.0000 mL | Freq: Two times a day (BID) | INTRAMUSCULAR | Status: DC
  Administered 2011-07-26 – 2011-07-27 (×2): 3 mL via INTRAVENOUS

## 2011-07-26 NOTE — Consult Note (Signed)
[facility] Consultation  Referring Provider: [facility] Primary Care Physician:  Norberto Sorenson, MD Primary Gastroenterologist:   None Reason for Consultation:  Pancreatitis  HPI: Randall Beck is a 25 y.o. male with a history of ETOH abuse admitted with his first episode of acute pancreatitis. Patient drank heavily around Christmas and the day after Christmas developed severe upper abdominal pain. He is still requiring pain medications on a frequent basis but states they have limited effectiveness. Despite pain patient wants to eat and move toward discharge as he has a new job and needs to get back to work.  Patient has no significant medical history other than a rotator cuff injury which has been a significant source of pain. Patient hasn't had the financial means to get surgery. He was taking Lyrica but it was too expensive to continue. ETOH used to help ease his pain.   Past Medical History  Diagnosis Date  . Alcohol abuse   . Rotator cuff disorder   . Pancreatitis 06/2011  . Tobacco abuse     Past Surgical History  Procedure Date  . No past surgeries     Prior to Admission medications   Medication Sig Start Date End Date Taking? Authorizing Provider  acetaminophen (TYLENOL) 325 MG tablet Take 650 mg by mouth every 6 (six) hours as needed. pain    Yes Historical Provider, MD    Current Facility-Administered Medications  Medication Dose Route Frequency Provider Last Rate Last Dose  . 0.9 %  sodium chloride infusion   Intravenous Continuous Anand Hongalgi 75 mL/hr at 07/26/11 0630    . folic acid (FOLVITE) tablet 1 mg  1 mg Oral Daily Carlota Raspberry, MD   1 mg at 07/25/11 0950  . LORazepam (ATIVAN) tablet 1 mg  1 mg Oral Q6H PRN Carlota Raspberry, MD   1 mg at 07/23/11 2247   Or  . LORazepam (ATIVAN) injection 1 mg  1 mg Intravenous Q6H PRN Carlota Raspberry, MD      . morphine 2 MG/ML injection 2-4 mg  2-4 mg Intravenous Q3H PRN Anand Hongalgi   4 mg at 07/26/11 0609  .  mulitivitamin with minerals tablet 1 tablet  1 tablet Oral Daily Carlota Raspberry, MD   1 tablet at 07/25/11 0950  . nicotine (NICODERM CQ - dosed in mg/24 hours) patch 21 mg  21 mg Transdermal Daily Rolan Lipa   21 mg at 07/25/11 0954  . ondansetron (ZOFRAN) tablet 4 mg  4 mg Oral Q6H PRN Carlota Raspberry, MD       Or  . ondansetron [facility]) injection 4 mg  4 mg Intravenous Q6H PRN Carlota Raspberry, MD      . oxyCODONE (Oxy IR/ROXICODONE) immediate release tablet 5 mg  5 mg Oral Q4H PRN Carlota Raspberry, MD   5 mg at 07/26/11 0630  . thiamine (VITAMIN B-1) tablet 100 mg  100 mg Oral Daily Carlota Raspberry, MD   100 mg at 07/25/11 0950   Or  . thiamine (B-1) injection 100 mg  100 mg Intravenous Daily Carlota Raspberry, MD      . zolpidem (AMBIEN) tablet 5 mg  5 mg Oral QHS PRN Anand Hongalgi   5 mg at 07/26/11 0105    Allergies as of 07/23/2011  . (No Known Allergies)    Family History  Problem Relation Age of Onset  . Alcohol abuse    . Cirrhosis    . Healthy      History   Social History  .  Marital Status: Single    Spouse Name: N/A    Number of Children: N/A  . Years of Education: N/A   Occupational History  . Not on file.   Social History Main Topics  . Smoking status: Current Everyday Smoker -- 1.0 packs/day for 10 years    Types: Cigarettes  . Smokeless tobacco: Former Neurosurgeon  . Alcohol Use: 0.0 oz/week    12-24 Cans of beer per week     daily  . Drug Use: Yes    Special: Marijuana, Hydrocodone     occasional marijuana  . Sexually Active: No    Social History Narrative   Living with his mom, in order to not live with his dad who he describes as daily severe drinker, with cirrhosis. Got a job at [employer] and enrolled at college after stopping drinking in rehab in 05/2011. Has insight into consequences of his alcohol abuse. Smokes 1/2-1 ppd for past 10 yrs, occasional marijuana use at parties, but denies any harder drugs, denies IVDU. Does endorse h/o shakiness and withdrawal symptoms  in the past but no h/o seizures, ICU admissions for withdrawal.     Review of Systems: Chronic shoulder pain from rotator cuff injury. All other systems reviewed and negative except where noted in HPI  PHYSICAL EXAM: Vital signs in last 24 hours: Temp:  [97.7 F (36.5 C)-98.4 F (36.9 C)] 97.8 F (36.6 C) (12/30 0600) Pulse Rate:  [76-83] 76  (12/30 0600) Resp:  [18] 18  (12/30 0600) BP: (129-134)/(79-98) 130/98 mmHg (12/30 0600) SpO2:  [99 %-100 %] 100 % (12/30 0600) Last BM Date: 07/22/11 General:  Petite but well-developed white male in NAD Head:  Normocephalic and atraumatic. Eyes:   No icterus.   Conjunctiva pink. Ears:  Normal auditory acuity. Mouth:  Tongue moist. Neck:  Supple; no masses felt Lungs:  Respirations even and unlabored. Lungs clear to auscultation bilaterlly.   No wheezes, crackles, or rhonchi.  Heart:  Regular rate and rhythm; no murmurs heard. Abdomen:  Soft, nondistended, mild upper abdominal tenderness. Normal bowel sounds. No appreciable masses or hepatomegaly.  Rectal:  Not performed.  Msk:  Symmetrical without gross deformities.  Extremities:  Without edema. Neurologic:  Alert and  oriented x4;  grossly normal neurologically. Skin:  Intact without significant lesions or rashes. Cervical Nodes:  No significant cervical adenopathy. Psych:  Alert and cooperative. Normal mood and affect.   LAB RESULTS:  Basename 07/26/11 0620 07/25/11 0640 07/24/11 0531  WBC 6.5 9.0 11.5*  HGB 14.4 15.2 15.3  HCT 43.2 44.5 44.2  PLT 172 172 164   BMET  Basename 07/25/11 0640 07/24/11 0531  NA 139 134*  K 4.0 3.5  CL 100 98  CO2 30 28  GLUCOSE 88 99  BUN 6 5*  CREATININE 0.91 0.85  CALCIUM 9.6 9.1   Studies: RADIOLOGY REPORT*  Clinical Data: Mid abdominal pain, elevated lipase and  leukocytosis.  CT ABDOMEN AND PELVIS WITH CONTRAST  Technique: Multidetector CT imaging of the abdomen and pelvis was  performed following the standard protocol during  bolus  administration of intravenous contrast.  Contrast: OMNIPAQUE IOHEXOL 300 MG/ML IV SOLN  Comparison: None.  Findings: Minimal right basilar atelectasis is noted.  There is soft tissue inflammation about the head of the pancreas,  with significant edema and fluid tracking inferiorly from the  pancreatic head along the retroperitoneum and Gerota's fascia on  the right side. This extends to the level of the upper pelvis.  The  extent and distribution of soft tissue edema is slightly  unusual, but raises concern for pancreatitis. There is no evidence  for pseudocyst formation or devascularization at this time. The  remainder of the pancreas is unremarkable in appearance.  The liver and spleen are unremarkable in appearance. The  gallbladder is within normal limits. The adrenal glands are normal  in appearance.  The kidneys are unremarkable in appearance. There is no evidence  of hydronephrosis. No renal or ureteral stones are seen. No  perinephric stranding is appreciated.  No free fluid is identified. The small bowel is unremarkable in  appearance. The stomach is within normal limits. No acute  vascular abnormalities are seen.  The appendix contains air, with minimal adjacent stranding; this  likely remains within normal limits, without definite evidence for  appendicitis. Mild colonic wall thickening adjacent to the head of  the pancreas is likely reactive in nature. The colon is largely  filled with stool; a single diverticulum is noted along the splenic  flexure of the colon.  The bladder is mildly distended and grossly unremarkable in  appearance. The prostate remains normal in size. No inguinal  lymphadenopathy is seen.  No acute osseous abnormalities are identified.  IMPRESSION:  1. Suspect acute pancreatitis, with soft tissue inflammation about  the head of the pancreas, and significant edema and fluid tracking  inferiorly along the retroperitoneum and Gerota's  fascia on the  right side. Edema extends to the level of the upper pelvis; the  extent and distribution of edema is slightly unusual. No evidence  for pseudocyst formation or devascularization.  2. No evidence for appendicitis.  Original Report Authenticated By: Tonia Ghent, M.D  PREVIOUS ENDOSCOPIES: none  IMPRESSION / PLAN: 59. 25 year old white male admitted 07/23/11 with first episode of acute ETOH pancreatitis. Patient is approximately 72 hours out from diagnosis. He is hemodynamically stable, afebrile with a normal WBC, normal renal function and hematocrit of 43 .  Patient still requiring frequent pain medications but begging to eat. He would like to transition to oral pain meds and move toward discharge as soon as possible. We discussed that eating may or may not worsen his pain. He is concerned that the oral pain medication he is currently taking will be inadequate to control his pain. Will give him clear liquids, discontinue IV pain meds ("they don't work anyway"). His oral pain medication regimen will obviously need to be changed.   2. ETOH abuse. We discussed potential for chronic pancreatitis and/ or cirrhosis should he continue to imbibe. Patient wants to, and feels he can discontinue ETOH.  3. Chronic rotator cuff pain, obviously ETOH isn't appropriate way to manage the pain. Patient has a new job now. Maybe financial situation will improve and he can address shoulder injury appropriately.   Thanks,    LOS: 3 days   Willette Cluster  07/26/2011, 9:50 AM

## 2011-07-26 NOTE — Consult Note (Signed)
I have taken a history, examined the patient and reviewed the chart. I agree with the extender's note, impression and recommendations.  Malcolm T Stark MD FACG 

## 2011-07-26 NOTE — Progress Notes (Signed)
Summary 25 year old male with history of narcotic abuse and alcohol abuse status post detox in early December 2012, tobacco abuse came in with abdominal pain, nausea and vomiting after an alcohol binge on Christmas night. CT abdomen and his lipase were suggestive of acute pancreatitis. Patient was slowly improving but today says that since last night his abdominal pain has worsened. It's difficult to objectively evaluate his pain. Discussed with [facility] who will see him tomorrow. They however did not think that repeating a CAT scan within 48 hours would reveal anything new. Will consider re\re imaging if he has worsening of his abdominal pain, leukocytosis or develops a fever.   Subjective: Patient says that his abdominal pain is improving.  He is tolerating his diet well.   Objective: Blood pressure 147/88, pulse 73, temperature 97.6 F (36.4 C), temperature source Oral, resp. rate 20, height 5\' 11"  (1.803 m), weight 81.647 kg (180 lb), SpO2 99.00%.  Intake/Output Summary (Last 24 hours) at 07/26/11 1822 Last data filed at 07/26/11 1700  Gross per 24 hour  Intake 1937.56 ml  Output    870 ml  Net 1067.56 ml   General exam: Comfortable. Looks much better than he did yesterday. Respiratory system: Clear. Cardiovascular system: first and second heart sounds heard, regular. Telemetry shows sinus rhythm with occasional PACs. Gastrointestinal system: Abdomen is non distended. When he is distracted and abdomen is examined there is no tenderness and abdomen is soft. Bowel sounds are normally heard. Central nervous system: Alert and oriented. No focal neurological deficits.  Lab Results: Basic Metabolic Panel:  Basename 07/25/11 0640 07/24/11 0531  NA 139 134*  K 4.0 3.5  CL 100 98  CO2 30 28  GLUCOSE 88 99  BUN 6 5*  CREATININE 0.91 0.85  CALCIUM 9.6 9.1  MG -- --  PHOS -- --   Liver Function Tests: No results found for this basename:  AST:2,ALT:2,ALKPHOS:2,BILITOT:2,PROT:2,ALBUMIN:2 in the last 72 hours  Basename 07/25/11 0640  LIPASE 20  AMYLASE --   No results found for this basename: AMMONIA:2 in the last 72 hours CBC:  Basename 07/26/11 0620 07/25/11 0640  WBC 6.5 9.0  NEUTROABS -- --  HGB 14.4 15.2  HCT 43.2 44.5  MCV 92.9 94.1  PLT 172 172   Urine Drug Screen: Drugs of Abuse     Component Value Date/Time   LABOPIA NONE DETECTED 06/24/2011 0912   COCAINSCRNUR NONE DETECTED 06/24/2011 0912   LABBENZ NONE DETECTED 06/24/2011 0912   AMPHETMU NONE DETECTED 06/24/2011 0912   THCU POSITIVE* 06/24/2011 0912   LABBARB NONE DETECTED 06/24/2011 0912    Alcohol Level: No results found for this basename: ETH:2 in the last 72 hours  Studies/Results: Ct Abdomen Pelvis W Contrast  07/23/2011  *RADIOLOGY REPORT*  Clinical Data: Mid abdominal pain, elevated lipase and leukocytosis.  CT ABDOMEN AND PELVIS WITH CONTRAST    IMPRESSION:  1.  Suspect acute pancreatitis, with soft tissue inflammation about the head of the pancreas, and significant edema and fluid tracking inferiorly along the retroperitoneum and Gerota's fascia on the right side.  Edema extends to the level of the upper pelvis; the extent and distribution of edema is slightly unusual.  No evidence for pseudocyst formation or devascularization. 2.  No evidence for appendicitis.  Original Report Authenticated By: Tonia Ghent, M.D.    Medications: Scheduled Meds:    . folic acid  1 mg Oral Daily  . morphine  15 mg Oral Q12H  .  morphine injection  2-4  mg Intravenous Once  . mulitivitamin with minerals  1 tablet Oral Daily  . nicotine  21 mg Transdermal Daily  . sodium chloride  3 mL Intravenous Q12H  . thiamine  100 mg Oral Daily   Or  . thiamine  100 mg Intravenous Daily   Continuous Infusions:    . DISCONTD: sodium chloride 75 mL/hr at 07/26/11 0630   PRN Meds:.sodium chloride, LORazepam, LORazepam, ondansetron (ZOFRAN) IV, ondansetron,  oxyCODONE, sodium chloride, zolpidem, DISCONTD:  morphine injection  Assessment/Plan: 1. Acute pancreatitis, possibly precipitated by alcohol intoxication:  Start him on MSContin with PRN Oxycodone.  Discussed with GI; appreciate  Their input. 2. History of alcohol dependence: No features of withdrawal.  3. Tobacco abuse 4. Polysubstance abuse: Marijuana: Cessation counseled. 5. Leukocytosis: Improved  Disposition:  1-2 days    Earlene Plater MD, [place] 07/26/2011, 6:22 PM

## 2011-07-27 DIAGNOSIS — K859 Acute pancreatitis without necrosis or infection, unspecified: Secondary | ICD-10-CM

## 2011-07-27 DIAGNOSIS — R933 Abnormal findings on diagnostic imaging of other parts of digestive tract: Secondary | ICD-10-CM

## 2011-07-27 DIAGNOSIS — R1013 Epigastric pain: Secondary | ICD-10-CM

## 2011-07-27 MED ORDER — BISACODYL 10 MG RE SUPP
10.0000 mg | Freq: Once | RECTAL | Status: AC
  Administered 2011-07-27: 10 mg via RECTAL
  Filled 2011-07-27: qty 1

## 2011-07-27 MED ORDER — POLYETHYLENE GLYCOL 3350 17 G PO PACK
17.0000 g | PACK | Freq: Two times a day (BID) | ORAL | Status: DC
  Administered 2011-07-27 – 2011-07-28 (×3): 17 g via ORAL
  Filled 2011-07-27 (×4): qty 1

## 2011-07-27 NOTE — Progress Notes (Signed)
Patient seen and I agree with the above documentation, including the assessment and plan.  

## 2011-07-27 NOTE — Progress Notes (Signed)
[facility] Progress Note  SUBJECTIVE: Still has abdominal pain but it has improved. Starving.  OBJECTIVE:  Vital signs in last 24 hours: Temp:  [97.6 F (36.4 C)-98 F (36.7 C)] 97.7 F (36.5 C) (12/31 0540) Pulse Rate:  [73-86] 74  (12/31 0540) Resp:  [18-20] 18  (12/31 0540) BP: (112-147)/(81-97) 112/81 mmHg (12/31 0540) SpO2:  [98 %-100 %] 98 % (12/31 0540) Last BM Date: 07/22/11 General:   Well-developed,  white male in NAD Heart:  Regular rate and rhythm; no murmurs Lungs: CTA bilaterally Abdomen:  Soft, nondistended, mild upper abdominal tenderness and mild mid lower abdominal tenderness.  Normal bowel sounds. Extremities:  Without edema. Neurologic:  Alert and  oriented x4;  grossly normal neurologically. Psych:  Cooperative. Normal mood and affect.  Lab Results:  Basename 07/26/11 0620 07/25/11 0640  WBC 6.5 9.0  HGB 14.4 15.2  HCT 43.2 44.5  PLT 172 172   ASSESSMENT / PLAN:  54. 25 year old white male with acute ETOH pancreatitis, improving. Most recent labs yesterday reveal normal WBC, normal renal function. Patient afebrile, hemodynamically stable, pain manageable with oral narcotics. Will try full liquids. 2. ETOH abuse. We discussed potential for chronic pancreatitis and/ or cirrhosis should he continue to imbibe. Patient wants, and feels he can discontinue ETOH.  3. Constipation. Begin Miralax, suppository today.    LOS: 4 days   Willette Cluster  07/27/2011, 8:27 AM

## 2011-07-27 NOTE — Progress Notes (Signed)
Summary 25 year old male with history of narcotic abuse and alcohol abuse status post detox in early December 2012, tobacco abuse came in with abdominal pain, nausea and vomiting after an alcohol binge on Christmas night. CT abdomen and his lipase were suggestive of acute pancreatitis. Patient was slowly improving but today says that since last night his abdominal pain has worsened. It's difficult to objectively evaluate his pain. Discussed with [facility] who will see him tomorrow. They however did not think that repeating a CAT scan within 48 hours would reveal anything new. Will consider re\re imaging if he has worsening of his abdominal pain, leukocytosis or develops a fever.   Subjective: Patient says that his abdominal pain is improving.  He is tolerating his full liquid diet well.   Objective: Blood pressure 129/94, pulse 90, temperature 98 F (36.7 C), temperature source Oral, resp. rate 18, height 5\' 11"  (1.803 m), weight 81.647 kg (180 lb), SpO2 91.00%.  Intake/Output Summary (Last 24 hours) at 07/27/11 1845 Last data filed at 07/27/11 1425  Gross per 24 hour  Intake    663 ml  Output    610 ml  Net     53 ml   General exam: Comfortable. Looks much better than he did yesterday. Respiratory system: Clear. Cardiovascular system: first and second heart sounds heard, regular. Telemetry shows sinus rhythm with occasional PACs. Gastrointestinal system: Abdomen is non distended. When he is distracted and abdomen is examined there is no tenderness and abdomen is soft. Bowel sounds are normally heard. Central nervous system: Alert and oriented. No focal neurological deficits.  Lab Results: Basic Metabolic Panel:  Basename 07/25/11 0640  NA 139  K 4.0  CL 100  CO2 30  GLUCOSE 88  BUN 6  CREATININE 0.91  CALCIUM 9.6  MG --  PHOS --   Liver Function Tests: No results found for this basename: AST:2,ALT:2,ALKPHOS:2,BILITOT:2,PROT:2,ALBUMIN:2 in the last 72  hours  Basename 07/25/11 0640  LIPASE 20  AMYLASE --   No results found for this basename: AMMONIA:2 in the last 72 hours CBC:  Basename 07/26/11 0620 07/25/11 0640  WBC 6.5 9.0  NEUTROABS -- --  HGB 14.4 15.2  HCT 43.2 44.5  MCV 92.9 94.1  PLT 172 172   Urine Drug Screen: Drugs of Abuse     Component Value Date/Time   LABOPIA NONE DETECTED 06/24/2011 0912   COCAINSCRNUR NONE DETECTED 06/24/2011 0912   LABBENZ NONE DETECTED 06/24/2011 0912   AMPHETMU NONE DETECTED 06/24/2011 0912   THCU POSITIVE* 06/24/2011 0912   LABBARB NONE DETECTED 06/24/2011 0912    Alcohol Level: No results found for this basename: ETH:2 in the last 72 hours  Studies/Results: Ct Abdomen Pelvis W Contrast  07/23/2011  *RADIOLOGY REPORT*  Clinical Data: Mid abdominal pain, elevated lipase and leukocytosis.  CT ABDOMEN AND PELVIS WITH CONTRAST    IMPRESSION:  1.  Suspect acute pancreatitis, with soft tissue inflammation about the head of the pancreas, and significant edema and fluid tracking inferiorly along the retroperitoneum and Gerota's fascia on the right side.  Edema extends to the level of the upper pelvis; the extent and distribution of edema is slightly unusual.  No evidence for pseudocyst formation or devascularization. 2.  No evidence for appendicitis.  Original Report Authenticated By: Tonia Ghent, M.D.    Medications: Scheduled Meds:    . bisacodyl  10 mg Rectal Once  . folic acid  1 mg Oral Daily  . morphine  15 mg Oral Q12H  .  mulitivitamin with minerals  1 tablet Oral Daily  . nicotine  21 mg Transdermal Daily  . polyethylene glycol  17 g Oral BID  . sodium chloride  3 mL Intravenous Q12H  . thiamine  100 mg Oral Daily   Or  . thiamine  100 mg Intravenous Daily   Continuous Infusions:   PRN Meds:.sodium chloride, ondansetron (ZOFRAN) IV, ondansetron, oxyCODONE, sodium chloride, zolpidem  Assessment/Plan: 1. Acute pancreatitis, possibly precipitated by alcohol intoxication:   Started him on MSContin yesterday with PRN Oxycodone.  2. History of alcohol dependence: No features of withdrawal.  3. Tobacco abuse 4. Polysubstance abuse: Marijuana: Cessation counseled. 5. Leukocytosis: Improved 6. Disposition: One to 2 days. Disposition:  1-2 days    Earlene Plater MD, [facility] 07/27/2011, 6:45 PM

## 2011-07-28 DIAGNOSIS — K859 Acute pancreatitis without necrosis or infection, unspecified: Secondary | ICD-10-CM

## 2011-07-28 DIAGNOSIS — F1010 Alcohol abuse, uncomplicated: Secondary | ICD-10-CM

## 2011-07-28 LAB — COMPREHENSIVE METABOLIC PANEL
AST: 17 U/L (ref 0–37)
Albumin: 3.8 g/dL (ref 3.5–5.2)
Calcium: 9.8 mg/dL (ref 8.4–10.5)
Creatinine, Ser: 1 mg/dL (ref 0.50–1.35)
GFR calc non Af Amer: >90 mL/min (ref >90)

## 2011-07-28 MED ORDER — MORPHINE SULFATE CR 15 MG PO TB12: 15.0000 mg | ORAL_TABLET | Freq: Two times a day (BID) | ORAL | Status: AC

## 2011-07-28 MED ORDER — OXYCODONE HCL 5 MG PO TABS: 5.0000 mg | ORAL_TABLET | ORAL | Status: AC | PRN

## 2011-07-28 MED ORDER — POLYETHYLENE GLYCOL 3350 17 G PO PACK: 17.0000 g | PACK | Freq: Every day | ORAL | Status: AC

## 2011-07-28 NOTE — Progress Notes (Signed)
[facility] Progress Note  SUBJECTIVE: Had a lot of "laxative type pain" after Miralax and suppository yesterday. Hungry.   OBJECTIVE:  Vital signs in last 24 hours: Temp:  [97.8 F (36.6 C)-98.8 F (37.1 C)] 97.8 F (36.6 C) (01/01 0437) Pulse Rate:  [68-90] 72  (01/01 0437) Resp:  [18] 18  (01/01 0437) BP: (123-137)/(84-94) 130/90 mmHg (01/01 0437) SpO2:  [91 %-99 %] 99 % (01/01 0437) Last BM Date: 07/22/11 General:   Just waking up, appears comfortable.  Heart:  Regular rate and rhythm; no murmurs Abdomen:  Soft, minimal upper tenderness, normal bowel sounds. Neurologic:  Alert and  oriented x4;  grossly normal neurologically. Psych:  Cooperative. Normal mood and affect.  Lab Results:  [facility] 07/26/11 0620  WBC 6.5  HGB 14.4  HCT 43.2  PLT 172   BMET  Basename 07/28/11 0415  NA 134*  K 3.9  CL 96  CO2 28  GLUCOSE 100*  BUN 6  CREATININE 1.00  CALCIUM 9.8   LFT  Basename 07/28/11 0415  PROT 7.7  ALBUMIN 3.8  AST 17  ALT 17  ALKPHOS 48  BILITOT 0.6  BILIDIR --  IBILI --    ASSESSMENT / PLAN: 43. 26 year old white male with acute ETOH pancreatitis, improving. Patient afebrile, hemodynamically stable, pain manageable with oral narcotics. He tolerated full liquids. Will advance to low fat diet. He wants to go home today.  2. ETOH abuse. We discussed potential for chronic pancreatitis and/ or cirrhosis should he continue to imbibe. Patient wants, and feels he can discontinue ETOH.  3. Constipation. Yesterday he was given a suppository and started on Miralax. Had a small bowel movement. Encourage him to continue Miralax, especially while taking pain medications.   Will sign off, call with questions. Thanks  LOS: 5 days   Willette Cluster  07/28/2011, 9:03 AM

## 2011-07-28 NOTE — Discharge Summary (Signed)
Physician Discharge Summary  Patient ID: Randall Beck MRN: [id number] DOB/AGE: 1985/10/22 26 y.o.  Admit date: 07/23/2011 Discharge date: 07/28/2011  Discharge Diagnoses:  Tachycardia  Alcohol abuse  Pancreatitis   Discharge Medication List as of 07/28/2011  2:36 PM    START taking these medications   Details  morphine (MS CONTIN) 15 MG 12 hr tablet Take 1 tablet (15 mg total) by mouth every 12 (twelve) hours., Starting 07/28/2011, Until Thu 08/27/11, Print    oxyCODONE (OXY IR/ROXICODONE) 5 MG immediate release tablet Take 1 tablet (5 mg total) by mouth every 4 (four) hours as needed., Starting 07/28/2011, Until Fri 08/07/11, Print    polyethylene glycol (MIRALAX / GLYCOLAX) packet Take 17 g by mouth daily., Starting 07/28/2011, Until Fri 07/31/11, Normal      STOP taking these medications     acetaminophen (TYLENOL) 325 MG tablet         Discharge Orders    Future Orders Please Complete By Expires   Increase activity slowly         Follow-up Information    Follow up with [facility] on 09/21/2011. (at 9 am)    Contact information:   [address] [place] [address] [phone]       Follow up with [facility] on 08/21/2011. (at 10:15 am for eligibility )          Disposition: Home or Self Care  Discharged Condition: Stable  Full Code   Consults:   gastroenterology  HPI: 26yoM with h/o narcotics abuse s/p detox, alcohol abuse s/p detox 06/2011, now comes back with  one night alcohol binge causing epigastric pain and vomiting, and found to have pancreatitis.  Pt was last seen in the ED 11/28 requesting detox from alcohol, stating that he had h/o  narcotics abuse and had detoxed from narcotics last year and started drinking beer instead,  averaging 24 cans per day, occasional marijuana use but denying other drugs. He was arranged to  be placed in a detox program, to [facility] in [place]. He did the detox and states he hasn't drank  alcohol since then  until tonight, and things were actually going well such that he got a job at  [employer], and enrolled in college classes starting next semester. He moved in with his  mom as well, who he describes as a healthy "good [place] lady," as it appears that for most  of the year he was living with his dad, who he describes as a severe cirrhotic alcoholic who  doesn't eat for days and looks "like a skeleton" and he is surprised he is still alive.  He abstained from alcohol until Christmas night when he went to his dad's and had drinks. He  drank about a fifth of liquor himself, but denies any other drug use. Subsequently develops  severe abdominal pain, nausea and vomiting, inability to tolerate water.  In the ED, vitals significant for tachycardia to 110. Chem panel normal, including renal  13/0.78, glucose 111. Lipase was 277, rest of LFT's normal. WBC was 14.6 with 85% neutros, rest  of CBC normal. UA showed 15 ketones, 100 protein, o/w normal. CTAP with contrast was done which  was suspect for pancreatitis, soft tissue inflammation around the head of pancreas, edema and  fluid tracking inferiorly along retroperitoneum and Gerota's fascia on the right, but nothing  more worrisome.    PMH:  As per H & P FH: As per  H & P SH: As  per H & P Med: as per H & P Allergies and ROS: as per H&P   Discharge Exam: Blood pressure 130/90, pulse 72, temperature 97.8 F (36.6 C), temperature source Oral, resp. rate 18, height 5\' 11"  (1.803 m), weight 81.647 kg (180 lb), SpO2 99.00%. General exam: Comfortable. Looks much better than he did yesterday.  Respiratory system: Clear.  Cardiovascular system: first and second heart sounds heard, regular. Telemetry shows sinus rhythm with occasional PACs.  Gastrointestinal system: Abdomen is non distended. When he is distracted and abdomen is examined there is no tenderness and abdomen is soft. Bowel sounds are normally heard.  Central nervous system: Alert and  oriented. No focal neurological deficits  Hospital Course:  Tachycardia Tachycardia has resolved. His tachycardia was most likely secondary to pain and probably mild dehydration. Heart rate at the time of discharge is 72   Alcohol abuse Patient states that he will no longer drink alcohol. He has been detoxed. He states that he doesn't ever not one error have this pain again. He is still having some pain even though his liver functions are back to normal. Gave him some MS Contin #60 and 30 of oxycodone for breakthrough pain. He will keep his followup appointment.    Pancreatitis Patient was treated with bowel rest and pain medication. He feels much better today and is able to tolerate his diet even though he has some pain. He was given prescription for MS Contin and oxycodone for breakthrough pain. He'll followup with PCP. Patient was given appointment.  Patient was evaluated by GI in the hospital.    Labs:   Results for orders placed during the hospital encounter of 07/23/11 (from the past 48 hour(s))  COMPREHENSIVE METABOLIC PANEL     Status: Abnormal   Collection Time   07/28/11  4:15 AM      Component Value Range Comment   Sodium 134 (*) 135 - 145 (mEq/L)    Potassium 3.9  3.5 - 5.1 (mEq/L)    Chloride 96  96 - 112 (mEq/L)    CO2 28  19 - 32 (mEq/L)    Glucose, Bld 100 (*) 70 - 99 (mg/dL)    BUN 6  6 - 23 (mg/dL)    Creatinine, Ser 0.98  0.50 - 1.35 (mg/dL)    Calcium 9.8  8.4 - 10.5 (mg/dL)    Total Protein 7.7  6.0 - 8.3 (g/dL)    Albumin 3.8  3.5 - 5.2 (g/dL)    AST 17  0 - 37 (U/L)    ALT 17  0 - 53 (U/L)    Alkaline Phosphatase 48  39 - 117 (U/L)    Total Bilirubin 0.6  0.3 - 1.2 (mg/dL)    GFR calc non Af Amer >90  >90 (mL/min)    GFR calc Af Amer >90  >90 (mL/min)     Diagnostics:  Ct Abdomen Pelvis W Contrast  07/23/2011  *RADIOLOGY REPORT*  Clinical Data: Mid abdominal pain, elevated lipase and leukocytosis.  CT ABDOMEN AND PELVIS WITH CONTRAST  Technique:   Multidetector CT imaging of the abdomen and pelvis was performed following the standard protocol during bolus administration of intravenous contrast.  Contrast: OMNIPAQUE IOHEXOL 300 MG/ML IV SOLN  Comparison: None.  Findings: Minimal right basilar atelectasis is noted.  There is soft tissue inflammation about the head of the pancreas, with significant edema and fluid tracking inferiorly from the pancreatic head along the retroperitoneum and Gerota's fascia on the right side.  This extends to the level of the upper pelvis. The extent and distribution of soft tissue edema is slightly unusual, but raises concern for pancreatitis.  There is no evidence for pseudocyst formation or devascularization at this time.  The remainder of the pancreas is unremarkable in appearance.  The liver and spleen are unremarkable in appearance.  The gallbladder is within normal limits.  The adrenal glands are normal in appearance.  The kidneys are unremarkable in appearance.  There is no evidence of hydronephrosis.  No renal or ureteral stones are seen.  No perinephric stranding is appreciated.  No free fluid is identified.  The small bowel is unremarkable in appearance.  The stomach is within normal limits.  No acute vascular abnormalities are seen.  The appendix contains air, with minimal adjacent stranding; this likely remains within normal limits, without definite evidence for appendicitis.  Mild colonic wall thickening adjacent to the head of the pancreas is likely reactive in nature.  The colon is largely filled with stool; a single diverticulum is noted along the splenic flexure of the colon.  The bladder is mildly distended and grossly unremarkable in appearance.  The prostate remains normal in size.  No inguinal lymphadenopathy is seen.  No acute osseous abnormalities are identified.  IMPRESSION:  1.  Suspect acute pancreatitis, with soft tissue inflammation about the head of the pancreas, and significant edema and fluid  tracking inferiorly along the retroperitoneum and Gerota's fascia on the right side.  Edema extends to the level of the upper pelvis; the extent and distribution of edema is slightly unusual.  No evidence for pseudocyst formation or devascularization. 2.  No evidence for appendicitis.  Original Report Authenticated By: Tonia Ghent, M.D.   Time spent with patient and arranging discharge is approximately 30 minutes. SignedEarlene Plater MD, [facility] 07/28/2011, 2:49 PM

## 2011-07-28 NOTE — Progress Notes (Signed)
Patient seen and I agree with the above documentation, including the assessment and plan.  

## 2011-09-08 ENCOUNTER — Encounter (HOSPITAL_COMMUNITY): Payer: Self-pay

## 2011-09-08 ENCOUNTER — Emergency Department (HOSPITAL_COMMUNITY): Payer: Self-pay

## 2011-09-08 ENCOUNTER — Emergency Department (HOSPITAL_COMMUNITY)
Admission: EM | Admit: 2011-09-08 | Discharge: 2011-09-08 | Disposition: A | Discharge status: Home / Self Care | Payer: Self-pay | Attending: Emergency Medicine | Admitting: Emergency Medicine

## 2011-09-08 DIAGNOSIS — F17200 Nicotine dependence, unspecified, uncomplicated: Secondary | ICD-10-CM | POA: Insufficient documentation

## 2011-09-08 DIAGNOSIS — R10815 Periumbilic abdominal tenderness: Secondary | ICD-10-CM | POA: Insufficient documentation

## 2011-09-08 DIAGNOSIS — R109 Unspecified abdominal pain: Secondary | ICD-10-CM | POA: Insufficient documentation

## 2011-09-08 DIAGNOSIS — R110 Nausea: Secondary | ICD-10-CM | POA: Insufficient documentation

## 2011-09-08 LAB — CBC
Hemoglobin: 16 g/dL (ref 13.0–17.0)
MCH: 32.7 pg (ref 26.0–34.0)
MCV: 92.8 fL (ref 78.0–100.0)
RBC: 4.89 MIL/uL (ref 4.22–5.81)

## 2011-09-08 LAB — COMPREHENSIVE METABOLIC PANEL
Alkaline Phosphatase: 50 U/L (ref 39–117)
BUN: 15 mg/dL (ref 6–23)
GFR calc Af Amer: >90 mL/min (ref >90)
GFR calc non Af Amer: >90 mL/min (ref >90)
Glucose, Bld: 88 mg/dL (ref 70–99)
Potassium: 4 mEq/L (ref 3.5–5.1)
Total Bilirubin: 0.8 mg/dL (ref 0.3–1.2)
Total Protein: 8.3 g/dL (ref 6.0–8.3)

## 2011-09-08 LAB — DIFFERENTIAL
Eosinophils Absolute: 0.1 10*3/uL (ref 0.0–0.7)
Lymphs Abs: 2.5 10*3/uL (ref 0.7–4.0)
Monocytes Relative: 11 % (ref 3–12)
Neutrophils Relative %: 60 % (ref 43–77)

## 2011-09-08 LAB — URINALYSIS, ROUTINE W REFLEX MICROSCOPIC
Bilirubin Urine: NEGATIVE
Ketones, ur: NEGATIVE mg/dL
Nitrite: NEGATIVE
Protein, ur: NEGATIVE mg/dL
Specific Gravity, Urine: 1.008 (ref 1.005–1.030)
Urobilinogen, UA: 0.2 mg/dL (ref 0.0–1.0)

## 2011-09-08 LAB — LIPASE, BLOOD: Lipase: 40 U/L (ref 11–59)

## 2011-09-08 MED ORDER — ONDANSETRON HCL 4 MG/2ML IJ SOLN
4.0000 mg | Freq: Once | INTRAMUSCULAR | Status: AC
  Administered 2011-09-08: 4 mg via INTRAVENOUS
  Filled 2011-09-08: qty 2

## 2011-09-08 MED ORDER — HYDROMORPHONE HCL PF 1 MG/ML IJ SOLN
1.0000 mg | Freq: Once | INTRAMUSCULAR | Status: AC
  Administered 2011-09-08: 1 mg via INTRAVENOUS
  Filled 2011-09-08: qty 1

## 2011-09-08 MED ORDER — PROMETHAZINE HCL 25 MG PO TABS: 25.0000 mg | ORAL_TABLET | Freq: Four times a day (QID) | ORAL | Status: AC | PRN

## 2011-09-08 MED ORDER — OXYCODONE-ACETAMINOPHEN 5-325 MG PO TABS: 1.0000 | ORAL_TABLET | Freq: Four times a day (QID) | ORAL | Status: AC | PRN

## 2011-09-08 MED ORDER — IOHEXOL 300 MG/ML  SOLN
100.0000 mL | Freq: Once | INTRAMUSCULAR | Status: AC | PRN
  Administered 2011-09-08: 100 mL via INTRAVENOUS

## 2011-09-08 MED ORDER — RANITIDINE HCL 150 MG PO CAPS: 150.0000 mg | ORAL_CAPSULE | Freq: Two times a day (BID) | ORAL | Status: DC

## 2011-09-08 NOTE — ED Notes (Signed)
Patient transported to X-ray 

## 2011-09-08 NOTE — Discharge Instructions (Signed)
Follow up with [facility] this week.  Return if problems

## 2011-09-08 NOTE — ED Provider Notes (Signed)
History     CSN: [id number]  Arrival date & time 09/08/11  1309   First MD Initiated Contact with Patient 09/08/11 1502      Chief Complaint  Patient presents with  . Abdominal Pain    upper abdomen/back    (Consider location/radiation/quality/duration/timing/severity/associated sxs/prior treatment) Patient is a 26 y.o. male presenting with abdominal pain. The history is provided by the patient (Patient complains of moderate to severe abdominal pain with some nausea. Patient states it feels similar to when he had pancreatitis a few months ago appear). No language interpreter was used.  Abdominal Pain The primary symptoms of the illness include abdominal pain. The primary symptoms of the illness do not include fever, fatigue or diarrhea. The current episode started 13 to 24 hours ago. The onset of the illness was sudden. The problem has not changed since onset. The illness is associated with NSAID use. The patient states that she believes she is currently not pregnant. The patient has not had a change in bowel habit. Symptoms associated with the illness do not include chills, anorexia, hematuria, frequency or back pain. Significant associated medical issues do not include PUD.    Past Medical History  Diagnosis Date  . Alcohol abuse     Father is strongly alcoholic with cirrhosis. Rehab stay in 06/2011  . Rotator cuff disorder   . Pancreatitis 06/2011  . Tobacco abuse     Past Surgical History  Procedure Date  . No past surgeries     Family History  Problem Relation Age of Onset  . Alcohol abuse    . Cirrhosis    . Healthy      History  Substance Use Topics  . Smoking status: Current Everyday Smoker -- 0.5 packs/day for 10 years    Types: Cigarettes  . Smokeless tobacco: Former Neurosurgeon  . Alcohol Use: 0.0 oz/week     was daily drinker.  drank 10 beers past sunday.      Review of Systems  Constitutional: Negative for fever, chills and fatigue.  HENT: Negative for  congestion, sinus pressure and ear discharge.   Eyes: Negative for discharge.  Respiratory: Negative for cough.   Cardiovascular: Negative for chest pain.  Gastrointestinal: Positive for abdominal pain. Negative for diarrhea and anorexia.  Genitourinary: Negative for frequency and hematuria.  Musculoskeletal: Negative for back pain.  Skin: Negative for rash.  Neurological: Negative for seizures and headaches.  Hematological: Negative.   Psychiatric/Behavioral: Negative for hallucinations.    Allergies  Review of patient's allergies indicates no known allergies.  Home Medications   Current Outpatient Rx  Name Route Sig Dispense Refill  . GUANFACINE HCL 1 MG PO TABS Oral Take 1 mg by mouth at bedtime.    . OXYCODONE-ACETAMINOPHEN 5-325 MG PO TABS Oral Take 1 tablet by mouth every 6 (six) hours as needed for pain. 30 tablet 0  . PROMETHAZINE HCL 25 MG PO TABS Oral Take 1 tablet (25 mg total) by mouth every 6 (six) hours as needed for nausea. 15 tablet 0  . RANITIDINE HCL 150 MG PO CAPS Oral Take 1 capsule (150 mg total) by mouth 2 (two) times daily. 30 capsule 0    BP 141/96  Pulse 94  Temp(Src) 98.5 F (36.9 C) (Oral)  Resp 16  Ht 5\' 11"  (1.803 m)  Wt 170 lb (77.111 kg)  BMI 23.71 kg/m2  SpO2 100%  Physical Exam  Constitutional: He is oriented to person, place, and time. He appears well-developed.  HENT:  Head: Normocephalic and atraumatic.  Eyes: Conjunctivae and EOM are normal. No scleral icterus.  Neck: Neck supple. No thyromegaly present.  Cardiovascular: Normal rate and regular rhythm.  Exam reveals no gallop and no friction rub.   No murmur heard. Pulmonary/Chest: No stridor. He has no wheezes. He has no rales. He exhibits no tenderness.  Abdominal: He exhibits no distension. There is tenderness. There is no rebound.       Moderate tendernous periumbilical  Musculoskeletal: Normal range of motion. He exhibits no edema.  Lymphadenopathy:    He has no cervical  adenopathy.  Neurological: He is oriented to person, place, and time. Coordination normal.  Skin: No rash noted. No erythema.  Psychiatric: He has a normal mood and affect. His behavior is normal.    ED Course  Procedures (including critical care time)   Labs Reviewed  CBC  DIFFERENTIAL  COMPREHENSIVE METABOLIC PANEL  LIPASE, BLOOD  URINALYSIS, ROUTINE W REFLEX MICROSCOPIC   Ct Abdomen Pelvis W Contrast  09/08/2011  *RADIOLOGY REPORT*  Clinical Data: Pancreatitis  CT ABDOMEN AND PELVIS WITH CONTRAST  Technique:  Multidetector CT imaging of the abdomen and pelvis was performed following the standard protocol during bolus administration of intravenous contrast.  Contrast: OMNIPAQUE IOHEXOL 300 MG/ML IV SOLN  Comparison: 07/23/2011  Findings: Lung bases are clear.  Liver, spleen, and adrenal glands are within normal limits.  No peripancreatic inflammatory changes to confirm a diagnosis of acute pancreatitis.  Gallbladder is unremarkable.  No intrahepatic or extrahepatic ductal dilatation.  No evidence of bowel obstruction.  Normal appendix.  No evidence of abdominal aortic aneurysm.  No abdominopelvic ascites.  No suspicious abdominopelvic lymphadenopathy.  Prostate is unremarkable.  Bladder is within normal limits.  Visualized osseous structures are within normal limits.  IMPRESSION: No peripancreatic inflammatory changes by CT to confirm a diagnosis of acute pancreatitis.  No evidence of bowel obstruction.  Normal appendix.  Original Report Authenticated By: Charline Bills, M.D.   Dg Abd Acute W/chest  09/08/2011  *RADIOLOGY REPORT*  Clinical Data: Abdominal pain.  Nausea.  History pancreatitis.  ACUTE ABDOMEN SERIES (ABDOMEN 2 VIEW & CHEST 1 VIEW)  Comparison: None.  Findings:  Cardiopericardial silhouette within normal limits. Mediastinal contours normal. Trachea midline.  No airspace disease or effusion.  The bowel gas pattern is nonobstructive.  On the upright view, there are  scattered air fluid levels, which are nonspecific and could be associated with ileus.  There is no small bowel dilation. No intra-abdominal free air.  No organomegaly.  IMPRESSION: Multiple air fluid levels in small bowel.  These are nonspecific and could be associated with ileus or enteritis, most commonly infectious.  Original Report Authenticated By: Andreas Newport, M.D.     1. Abdominal pain    Pt improved with tx   MDM  Patient has abdominal pain. With normal studies for pancreatitis. Possible viral gastroenteritis.        Benny Lennert, MD 09/08/11 [phone]

## 2011-09-08 NOTE — ED Notes (Signed)
Hx pancreatitis.  Treated here for same around christmas.  Has not had any ETOH since, except for this past w/e.  Admits to 10 beers on Sunday.  Has nausea, no vomiting.

## 2012-02-19 ENCOUNTER — Inpatient Hospital Stay (HOSPITAL_COMMUNITY)
Admission: EM | Admit: 2012-02-19 | Discharge: 2012-02-21 | DRG: 897 | Discharge status: Against Medical Advice | Payer: Self-pay | Attending: Internal Medicine | Admitting: Internal Medicine

## 2012-02-19 ENCOUNTER — Encounter (HOSPITAL_COMMUNITY): Payer: Self-pay | Admitting: *Deleted

## 2012-02-19 DIAGNOSIS — K859 Acute pancreatitis without necrosis or infection, unspecified: Secondary | ICD-10-CM

## 2012-02-19 DIAGNOSIS — F10239 Alcohol dependence with withdrawal, unspecified: Principal | ICD-10-CM

## 2012-02-19 DIAGNOSIS — Z720 Tobacco use: Secondary | ICD-10-CM

## 2012-02-19 DIAGNOSIS — R1013 Epigastric pain: Secondary | ICD-10-CM | POA: Diagnosis present

## 2012-02-19 DIAGNOSIS — F10939 Alcohol use, unspecified with withdrawal, unspecified: Principal | ICD-10-CM | POA: Diagnosis present

## 2012-02-19 DIAGNOSIS — R109 Unspecified abdominal pain: Secondary | ICD-10-CM | POA: Diagnosis present

## 2012-02-19 DIAGNOSIS — F1010 Alcohol abuse, uncomplicated: Secondary | ICD-10-CM | POA: Diagnosis present

## 2012-02-19 DIAGNOSIS — R112 Nausea with vomiting, unspecified: Secondary | ICD-10-CM | POA: Diagnosis present

## 2012-02-19 DIAGNOSIS — F17200 Nicotine dependence, unspecified, uncomplicated: Secondary | ICD-10-CM | POA: Diagnosis present

## 2012-02-19 DIAGNOSIS — E860 Dehydration: Secondary | ICD-10-CM | POA: Diagnosis present

## 2012-02-19 DIAGNOSIS — K2920 Alcoholic gastritis without bleeding: Secondary | ICD-10-CM | POA: Diagnosis present

## 2012-02-19 LAB — CBC WITH DIFFERENTIAL/PLATELET
Basophils Absolute: 0 10*3/uL (ref 0.0–0.1)
Lymphocytes Relative: 37 % (ref 12–46)
Neutro Abs: 2.2 10*3/uL (ref 1.7–7.7)
Platelets: 264 10*3/uL (ref 150–400)
RDW: 13.1 % (ref 11.5–15.5)
WBC: 5 10*3/uL (ref 4.0–10.5)

## 2012-02-19 LAB — HEPATIC FUNCTION PANEL
ALT: 56 U/L — ABNORMAL HIGH (ref 0–53)
AST: 62 U/L — ABNORMAL HIGH (ref 0–37)
Total Protein: 8.7 g/dL — ABNORMAL HIGH (ref 6.0–8.3)

## 2012-02-19 LAB — URINALYSIS, ROUTINE W REFLEX MICROSCOPIC
Bilirubin Urine: NEGATIVE
Hgb urine dipstick: NEGATIVE
Nitrite: NEGATIVE
Specific Gravity, Urine: 1.025 (ref 1.005–1.030)
pH: 5.5 (ref 5.0–8.0)

## 2012-02-19 LAB — URINE MICROSCOPIC-ADD ON

## 2012-02-19 LAB — BASIC METABOLIC PANEL
BUN: 8 mg/dL (ref 6–23)
Chloride: 97 mEq/L (ref 96–112)
GFR calc Af Amer: >90 mL/min (ref >90)
Glucose, Bld: 82 mg/dL (ref 70–99)
Potassium: 4.2 mEq/L (ref 3.5–5.1)

## 2012-02-19 LAB — LACTATE DEHYDROGENASE: LDH: 325 U/L — ABNORMAL HIGH (ref 94–250)

## 2012-02-19 MED ORDER — LORAZEPAM 1 MG PO TABS
2.0000 mg | ORAL_TABLET | Freq: Once | ORAL | Status: AC
  Administered 2012-02-19: 2 mg via ORAL
  Filled 2012-02-19: qty 2

## 2012-02-19 MED ORDER — HYDROMORPHONE HCL PF 1 MG/ML IJ SOLN
1.0000 mg | Freq: Once | INTRAMUSCULAR | Status: AC
  Administered 2012-02-19: 1 mg via INTRAVENOUS
  Filled 2012-02-19: qty 1

## 2012-02-19 MED ORDER — FOLIC ACID 1 MG PO TABS
1.0000 mg | ORAL_TABLET | Freq: Every day | ORAL | Status: DC
  Administered 2012-02-20: 1 mg via ORAL
  Filled 2012-02-19 (×2): qty 1

## 2012-02-19 MED ORDER — VITAMIN B-1 100 MG PO TABS
100.0000 mg | ORAL_TABLET | Freq: Every day | ORAL | Status: DC
  Administered 2012-02-20: 100 mg via ORAL
  Filled 2012-02-19 (×2): qty 1

## 2012-02-19 MED ORDER — ONDANSETRON 8 MG PO TBDP
8.0000 mg | ORAL_TABLET | Freq: Once | ORAL | Status: AC
  Administered 2012-02-19: 8 mg via ORAL

## 2012-02-19 MED ORDER — ONDANSETRON HCL 4 MG PO TABS: 4.0000 mg | ORAL_TABLET | Freq: Four times a day (QID) | ORAL | Status: DC | PRN

## 2012-02-19 MED ORDER — ADULT MULTIVITAMIN W/MINERALS CH
1.0000 | ORAL_TABLET | Freq: Every day | ORAL | Status: DC
  Administered 2012-02-20: 1 via ORAL
  Filled 2012-02-19 (×2): qty 1

## 2012-02-19 MED ORDER — ACETAMINOPHEN 325 MG PO TABS
650.0000 mg | ORAL_TABLET | Freq: Four times a day (QID) | ORAL | Status: DC | PRN
  Administered 2012-02-20: 650 mg via ORAL
  Filled 2012-02-19: qty 2

## 2012-02-19 MED ORDER — LORAZEPAM 2 MG/ML IJ SOLN
0.0000 mg | Freq: Four times a day (QID) | INTRAMUSCULAR | Status: DC
  Administered 2012-02-19: 2 mg via INTRAVENOUS
  Administered 2012-02-20: 4 mg via INTRAVENOUS
  Administered 2012-02-20: 2 mg via INTRAVENOUS
  Administered 2012-02-20 – 2012-02-21 (×2): 4 mg via INTRAVENOUS
  Filled 2012-02-19: qty 1
  Filled 2012-02-19 (×2): qty 2
  Filled 2012-02-19: qty 1
  Filled 2012-02-19: qty 2

## 2012-02-19 MED ORDER — PANTOPRAZOLE SODIUM 40 MG IV SOLR
40.0000 mg | Freq: Two times a day (BID) | INTRAVENOUS | Status: DC
  Administered 2012-02-20 (×3): 40 mg via INTRAVENOUS
  Filled 2012-02-19 (×5): qty 40

## 2012-02-19 MED ORDER — HYDROMORPHONE HCL PF 1 MG/ML IJ SOLN
1.0000 mg | INTRAMUSCULAR | Status: DC | PRN
  Administered 2012-02-19 – 2012-02-21 (×7): 1 mg via INTRAVENOUS
  Filled 2012-02-19 (×8): qty 1

## 2012-02-19 MED ORDER — SODIUM CHLORIDE 0.9 % IV SOLN
Freq: Once | INTRAVENOUS | Status: AC
  Administered 2012-02-19: 17:00:00 via INTRAVENOUS

## 2012-02-19 MED ORDER — ONDANSETRON HCL 4 MG/2ML IJ SOLN
4.0000 mg | Freq: Once | INTRAMUSCULAR | Status: AC
  Administered 2012-02-19: 4 mg via INTRAVENOUS
  Filled 2012-02-19: qty 2

## 2012-02-19 MED ORDER — ONDANSETRON HCL 4 MG/2ML IJ SOLN: 4.0000 mg | Freq: Three times a day (TID) | INTRAMUSCULAR | Status: AC | PRN

## 2012-02-19 MED ORDER — LORAZEPAM 2 MG/ML IJ SOLN
1.0000 mg | Freq: Four times a day (QID) | INTRAMUSCULAR | Status: DC | PRN
  Administered 2012-02-20 – 2012-02-21 (×2): 1 mg via INTRAVENOUS
  Filled 2012-02-19 (×4): qty 1

## 2012-02-19 MED ORDER — SODIUM CHLORIDE 0.9 % IV SOLN
INTRAVENOUS | Status: AC
  Administered 2012-02-19: via INTRAVENOUS

## 2012-02-19 MED ORDER — ONDANSETRON HCL 4 MG/2ML IJ SOLN
4.0000 mg | Freq: Four times a day (QID) | INTRAMUSCULAR | Status: DC | PRN
  Administered 2012-02-21: 4 mg via INTRAVENOUS
  Filled 2012-02-19: qty 2

## 2012-02-19 MED ORDER — SODIUM CHLORIDE 0.9 % IV BOLUS (SEPSIS)
1000.0000 mL | Freq: Once | INTRAVENOUS | Status: AC
  Administered 2012-02-19: 1000 mL via INTRAVENOUS

## 2012-02-19 MED ORDER — ONDANSETRON HCL 4 MG/2ML IJ SOLN
4.0000 mg | Freq: Once | INTRAMUSCULAR | Status: AC
Start: 2012-02-19 — End: 2012-02-19
  Administered 2012-02-19: 4 mg via INTRAVENOUS
  Filled 2012-02-19: qty 2

## 2012-02-19 MED ORDER — ENOXAPARIN SODIUM 40 MG/0.4ML ~~LOC~~ SOLN
40.0000 mg | Freq: Every day | SUBCUTANEOUS | Status: DC
  Administered 2012-02-20 (×2): 40 mg via SUBCUTANEOUS
  Filled 2012-02-19 (×3): qty 0.4

## 2012-02-19 MED ORDER — THIAMINE HCL 100 MG/ML IJ SOLN
Freq: Once | INTRAVENOUS | Status: AC
  Administered 2012-02-20: 01:00:00 via INTRAVENOUS
  Filled 2012-02-19: qty 1000

## 2012-02-19 MED ORDER — GI COCKTAIL ~~LOC~~
30.0000 mL | Freq: Three times a day (TID) | ORAL | Status: DC | PRN
  Administered 2012-02-20: 30 mL via ORAL
  Filled 2012-02-19: qty 30

## 2012-02-19 MED ORDER — ACETAMINOPHEN 650 MG RE SUPP: 650.0000 mg | Freq: Four times a day (QID) | RECTAL | Status: DC | PRN

## 2012-02-19 MED ORDER — ONDANSETRON 8 MG PO TBDP
ORAL_TABLET | ORAL | Status: AC
  Filled 2012-02-19: qty 1

## 2012-02-19 MED ORDER — LORAZEPAM 1 MG PO TABS: 1.0000 mg | ORAL_TABLET | Freq: Four times a day (QID) | ORAL | Status: DC | PRN

## 2012-02-19 MED ORDER — LORAZEPAM 2 MG/ML IJ SOLN: 0.0000 mg | Freq: Two times a day (BID) | INTRAMUSCULAR | Status: DC

## 2012-02-19 MED ORDER — FAMOTIDINE IN NACL 20-0.9 MG/50ML-% IV SOLN
20.0000 mg | Freq: Once | INTRAVENOUS | Status: AC
Start: 2012-02-19 — End: 2012-02-19
  Administered 2012-02-19: 20 mg via INTRAVENOUS
  Filled 2012-02-19: qty 50

## 2012-02-19 MED ORDER — THIAMINE HCL 100 MG/ML IJ SOLN
100.0000 mg | Freq: Every day | INTRAMUSCULAR | Status: DC
  Filled 2012-02-19 (×2): qty 1

## 2012-02-19 MED ORDER — THIAMINE HCL 100 MG/ML IJ SOLN
100.0000 mg | Freq: Every day | INTRAMUSCULAR | Status: DC
  Administered 2012-02-19: 100 mg via INTRAVENOUS
  Filled 2012-02-19: qty 1
  Filled 2012-02-19: qty 2

## 2012-02-19 NOTE — H&P (Signed)
[facility] History and Physical  Randall Beck WUJ:[id number] DOB: Nov 24, 1985 DOA: 02/19/2012  Referring physician:Dr. Rhunette Croft PCP: Norberto Sorenson, MD   Chief Complaint: nausea, vomiting, epigastric pain  HPI:  This is a 26 year old gentleman with a history of alcohol abuse and prior alcoholic pancreatitis. Patient reports since his last discharge, he was able to abstain from alcohol and did not have any recurrence of symptoms. He reports that 2 weeks ago he broke up with his girlfriend. In order to cope with this, he began drinking alcohol again. Patient reports drinking beer as well as liquor. He is not forthcoming as to how much he drinks, but reports that when he does start drinking, he can easily drink more than a case of beer a day. He says his last drink was last night. He woke up this morning with epigastric pain radiating to his back. He is associated nausea and vomiting. He is unable to keep anything down and has vomited multiple times. He denies any hematemesis. Denies any diarrhea or fever. Denies any shortness of breath, cough, chest pain. He feels his pain is similar to his prior pancreatitis episode. He has felt some chills today and also feels little tremulous. He was evaluated in the ER, and his symptoms did not improve with initial management. He has been referred for admission.  Review of Systems:  As per HPI, otherwise negative  Past Medical History  Diagnosis Date  . Alcohol abuse     Father is strongly alcoholic with cirrhosis. Rehab stay in 06/2011  . Rotator cuff disorder   . Pancreatitis 06/2011  . Tobacco abuse    Past Surgical History  Procedure Date  . No past surgeries    Social History:  reports that he has been smoking Cigarettes.  He has a 5 pack-year smoking history. He has quit using smokeless tobacco. He reports that he drinks alcohol. He reports that he uses illicit drugs (Marijuana and Hydrocodone).   No Known Allergies  Family History  Problem  Relation Age of Onset  . Alcohol abuse    . Cirrhosis    . Healthy      Prior to Admission medications   Not on File   Physical Exam: Filed Vitals:   02/19/12 1419 02/19/12 1641 02/19/12 1923  BP: 146/112 157/91 154/87  Pulse: 97 87 73  Temp: 98.4 F (36.9 C)    TempSrc: Oral    Resp: 20 16 16   SpO2: 98% 98% 98%     General:  Patient is sitting up in bed, appears to be uncomfortable and slightly tremulous  Eyes: Pupils are equal round react to light and accommodation  ENT: Mucous membranes are dry, no pharyngeal erythema  Neck: Supple  Cardiovascular: S1, S2, tachycardic  Respiratory: Clear to auscultation bilaterally  Abdomen: Soft, tenderness in the epigastrium, bowel sounds are active  Skin: No visible rashes  Musculoskeletal: Deferred  Psychiatric: Normal affect, cooperative with exam  Neurologic: Grossly intact, nonfocal, mild generalized tremor  Labs on Admission:  Basic Metabolic Panel:  Lab 02/19/12 9562  NA 138  K 4.2  CL 97  CO2 22  GLUCOSE 82  BUN 8  CREATININE 0.76  CALCIUM 9.7  MG --  PHOS --   Liver Function Tests:  Lab 02/19/12 1500  AST 62*  ALT 56*  ALKPHOS 54  BILITOT 1.1  PROT 8.7*  ALBUMIN 4.8    Lab 02/19/12 1500  LIPASE 55  AMYLASE --   No results found for this basename: AMMONIA:5  in the last 168 hours CBC:  Lab 02/19/12 1500  WBC 5.0  NEUTROABS 2.2  HGB 17.6*  HCT 48.6  MCV 94.7  PLT 264   Cardiac Enzymes: No results found for this basename: CKTOTAL:5,CKMB:5,CKMBINDEX:5,TROPONINI:5 in the last 168 hours  BNP (last 3 results) No results found for this basename: PROBNP:3 in the last 8760 hours CBG: No results found for this basename: GLUCAP:5 in the last 168 hours  Radiological Exams on Admission: No results found.    Assessment/Plan Principal Problem:  *Nausea and vomiting Active Problems:  Alcohol abuse  Abdominal pain  Alcohol withdrawal  Dehydration  Gastritis, alcoholic   1. Nausea,  vomiting and abdominal pain. Patient's lipase was found to be within normal limits. His liver function tests are mildly elevated likely due to alcohol abuse. It is possible the patient has developed a alcoholic gastritis. He'll be admitted to the hospital for supportive management. He'll be kept n.p.o. for now and given IV fluids. We will also give him pain medications and antiemetics. We will repeat a lipase level in the morning. We will also check a serum alcohol level as well as urine tox screen. He denies any other illicit drug use at this time. We will advance his diet as tolerated. He will also receive Protonix and GI cocktails. 2. Alcohol abuse. Patient was counseled regarding the harmful effects of alcohol. He indicated that he will be receiving counseling from his pastor in order to help him quit drinking. A social work consult may also be helpful. 3. Alcohol withdrawal. The patient does appear to be somewhat tremulous. We will start him on alcohol withdrawal protocol with Ativan.    4. Code Status: Full code Family Communication: discussed with patient Disposition Plan: discharge home once improved  Time spent:91mins  MEMON,JEHANZEB [facility] Pager [phone]  If 7PM-7AM, please contact night-coverage [email or web address] Password [facility] 02/19/2012, 9:14 PM

## 2012-02-19 NOTE — Progress Notes (Signed)
[facility] ED CM consulted by [facility] ED patient advocate to assist pt with concerns about paying his bill.  Pt reports loosing forms given to him to complete for a previous bill and wanting forms for that visit and this visit.  Pt referred to financial counselor and the contact number listed on his recent bill to reach an available financial counselor

## 2012-02-19 NOTE — ED Provider Notes (Signed)
History     CSN: [id number]  Arrival date & time 02/19/12  1414   First MD Initiated Contact with Patient 02/19/12 1500      Chief Complaint  Patient presents with  . Abdominal Pain    (Consider location/radiation/quality/duration/timing/severity/associated sxs/prior treatment) HPI Comments: Pt with hx of alcoholic pancreatitis comes in w/ cc of abd pain. Pt reports that after his last bout of pancreatitis, he stopped drinking until 2 weeks ago when he started drinking again. Last night, he woke up ion the middle of the night after having whiskey earlier with epigastric and upper quadrant abd pain that is constant, and moving to the back. The pain is severe and is associated with nausea and emesis. Pt has haf about 5-10 episodes of emesis thus far, mostly clears and some bilious. No hematemesis and no  melenotic or bloody stools. Pt has no other medical hx.  Patient is a 26 y.o. male presenting with abdominal pain. The history is provided by the patient and medical records.  Abdominal Pain The primary symptoms of the illness include nausea and vomiting. The primary symptoms of the illness do not include fever, shortness of breath or dysuria.  Symptoms associated with the illness do not include chills or constipation.    Past Medical History  Diagnosis Date  . Alcohol abuse     Father is strongly alcoholic with cirrhosis. Rehab stay in 06/2011  . Rotator cuff disorder   . Pancreatitis 06/2011  . Tobacco abuse     Past Surgical History  Procedure Date  . No past surgeries     Family History  Problem Relation Age of Onset  . Alcohol abuse    . Cirrhosis    . Healthy      History  Substance Use Topics  . Smoking status: Current Everyday Smoker -- 0.5 packs/day for 10 years    Types: Cigarettes  . Smokeless tobacco: Former Neurosurgeon  . Alcohol Use: 0.0 oz/week     was daily drinker.  drank 10 beers past sunday.      Review of Systems  Constitutional: Negative for fever,  chills and activity change.  HENT: Negative for neck pain.   Eyes: Negative for visual disturbance.  Respiratory: Negative for cough, chest tightness and shortness of breath.   Cardiovascular: Negative for chest pain.  Gastrointestinal: Positive for nausea and vomiting. Negative for constipation, blood in stool and abdominal distention.  Genitourinary: Negative for dysuria, enuresis and difficulty urinating.  Musculoskeletal: Negative for arthralgias.  Neurological: Positive for dizziness. Negative for light-headedness and headaches.  Psychiatric/Behavioral: Negative for confusion.    Allergies  Review of patient's allergies indicates no known allergies.  Home Medications  No current outpatient prescriptions on file.  BP 146/112  Pulse 97  Temp 98.4 F (36.9 C) (Oral)  Resp 20  SpO2 98%  Physical Exam  Constitutional: He is oriented to person, place, and time. He appears well-developed.  HENT:  Head: Normocephalic and atraumatic.  Eyes: Conjunctivae and EOM are normal. Pupils are equal, round, and reactive to light.  Neck: Normal range of motion. Neck supple.  Cardiovascular: Normal rate, regular rhythm and normal heart sounds.   Pulmonary/Chest: Effort normal and breath sounds normal. No respiratory distress. He has no wheezes.  Abdominal: Soft. Bowel sounds are normal. He exhibits no distension. There is tenderness. There is guarding. There is no rebound.       Upper quadrant abdominal pain, with voluntary guarding  Neurological: He is alert and oriented to  person, place, and time.  Skin: Skin is warm.    ED Course  Procedures (including critical care time)  Labs Reviewed  CBC WITH DIFFERENTIAL - Abnormal; Notable for the following:    Hemoglobin 17.6 (*)     MCH 34.3 (*)     MCHC 36.2 (*)     Monocytes Relative 18 (*)     All other components within normal limits  HEPATIC FUNCTION PANEL  LIPASE, BLOOD  BASIC METABOLIC PANEL  URINALYSIS, ROUTINE W REFLEX  MICROSCOPIC   No results found.   No diagnosis found.    MDM  DDx includes: Pancreatitis Hepatobiliary pathology including cholecystitis Gastritis/PUD SBO ACS syndrome Aortic Dissection  Patient with hx of pancreatitis comes in with cc of abd pain. The pain is typical to his previous pancreatitis and there is a known precipitant in this case. We will start hydration and get the appropriate labs at this time. No imaging ordered at this time. Will try to see if patient's symptoms can be can be controlled in the ED.         Derwood Kaplan, MD 02/19/12 [phone]

## 2012-02-19 NOTE — ED Notes (Signed)
Pt. Reporting that his hands are shaking.  MD notified.  Ativan ordered po.

## 2012-02-19 NOTE — ED Notes (Signed)
Pt state he has been drinking heavily over the past 2 weeks. Pt last drink last night at 11pm

## 2012-02-19 NOTE — ED Notes (Signed)
Pt reports hx of pancreatitis. States has been drinking for past few days and having pain to upper epi gastric pain, radiating to back. Nausea/vomiting. Reports fever/chills.

## 2012-02-20 ENCOUNTER — Inpatient Hospital Stay (HOSPITAL_COMMUNITY): Payer: Self-pay

## 2012-02-20 DIAGNOSIS — Z720 Tobacco use: Secondary | ICD-10-CM

## 2012-02-20 DIAGNOSIS — F1010 Alcohol abuse, uncomplicated: Secondary | ICD-10-CM

## 2012-02-20 DIAGNOSIS — K859 Acute pancreatitis without necrosis or infection, unspecified: Secondary | ICD-10-CM

## 2012-02-20 LAB — CREATININE, SERUM
Creatinine, Ser: 0.73 mg/dL (ref 0.50–1.35)
GFR calc Af Amer: >90 mL/min (ref >90)
GFR calc non Af Amer: >90 mL/min (ref >90)

## 2012-02-20 LAB — COMPREHENSIVE METABOLIC PANEL
Albumin: 3.9 g/dL (ref 3.5–5.2)
BUN: 8 mg/dL (ref 6–23)
Creatinine, Ser: 0.73 mg/dL (ref 0.50–1.35)
Total Protein: 6.9 g/dL (ref 6.0–8.3)

## 2012-02-20 LAB — RAPID URINE DRUG SCREEN, HOSP PERFORMED
Amphetamines: NOT DETECTED
Barbiturates: NOT DETECTED
Benzodiazepines: NOT DETECTED
Cocaine: POSITIVE — AB
Tetrahydrocannabinol: NOT DETECTED

## 2012-02-20 LAB — CBC
HCT: 41.2 % (ref 39.0–52.0)
Hemoglobin: 15.2 g/dL (ref 13.0–17.0)
Hemoglobin: 14.1 g/dL (ref 13.0–17.0)
MCHC: 35.3 g/dL (ref 30.0–36.0)
MCV: 96 fL (ref 78.0–100.0)
Platelets: 190 10*3/uL (ref 150–400)
RBC: 4.29 MIL/uL (ref 4.22–5.81)
RDW: 13.2 % (ref 11.5–15.5)
WBC: 5.6 10*3/uL (ref 4.0–10.5)
WBC: 5.2 10*3/uL (ref 4.0–10.5)

## 2012-02-20 MED ORDER — NICOTINE 14 MG/24HR TD PT24
14.0000 mg | MEDICATED_PATCH | Freq: Every day | TRANSDERMAL | Status: DC
  Administered 2012-02-20: 14 mg via TRANSDERMAL
  Filled 2012-02-20 (×2): qty 1

## 2012-02-20 MED ORDER — HYDROMORPHONE HCL PF 1 MG/ML IJ SOLN
1.0000 mg | Freq: Once | INTRAMUSCULAR | Status: AC
  Administered 2012-02-20: 1 mg via INTRAVENOUS
  Filled 2012-02-20: qty 1

## 2012-02-20 MED ORDER — QUETIAPINE FUMARATE 50 MG PO TABS
50.0000 mg | ORAL_TABLET | Freq: Every day | ORAL | Status: DC
  Administered 2012-02-20: 50 mg via ORAL
  Filled 2012-02-20 (×2): qty 1

## 2012-02-20 MED ORDER — SUCRALFATE 1 GM/10ML PO SUSP
1.0000 g | Freq: Three times a day (TID) | ORAL | Status: DC
  Administered 2012-02-20 (×2): 1 g via ORAL
  Filled 2012-02-20 (×7): qty 10

## 2012-02-20 MED ORDER — HYDROMORPHONE HCL PF 1 MG/ML IJ SOLN
1.0000 mg | Freq: Once | INTRAMUSCULAR | Status: AC
  Administered 2012-02-20: 1 mg via INTRAVENOUS

## 2012-02-20 MED ORDER — NICOTINE 14 MG/24HR TD PT24
14.0000 mg | MEDICATED_PATCH | Freq: Once | TRANSDERMAL | Status: DC
  Administered 2012-02-20: 14 mg via TRANSDERMAL
  Filled 2012-02-20: qty 1

## 2012-02-20 MED ORDER — NICOTINE 14 MG/24HR TD PT24: 14.0000 mg | MEDICATED_PATCH | Freq: Every day | TRANSDERMAL | Status: DC
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
## 2012-02-20 NOTE — Consult Note (Signed)
Reason for Consult: drug abuse Referring Physician: unknown  Randall Beck is an 26 y.o. male.  HPI:   This is a 26 year old gentleman with a history of alcohol dep, other drug abuse and prior alcoholic pancreatitis. Patient reports since his last discharge, he was able to abstain from alcohol and did not have any recurrence of symptoms. He reports that 2 weeks ago he broke up with his girlfriend. In order to cope with this, he began drinking alcohol again. Patient reports drinking beer as well as liquor. He is not forthcoming as to how much he drinks, but reports that when he does start drinking, he can easily drink more than a case of beer a day. He says his last drink was 1-2 days ago. He had recent epigastric pain radiating to his back. He is associated nausea and vomiting. He is unable to keep anything down and has vomited multiple times.  He has felt some chills today and also feels little tremulous. He was evaluated in the ER, and his symptoms did not improve with initial management. He has been referred for admission.   Admits drinking problems. Drinking since age 65. Also use other drugs like cocaine. Plans to stop etoh now. Never see psy as out pt. Thinks he has long hx of anxiety and mood wings, periods of being hyperness and racing thoughts. Wants help for that and thinks his drug abuse making him worse. Chronic insomnia for years and drinks to help that. No SI or SI attempts in past. No psychotic symptoms. Never tried any psy meds for his mood symptoms.   Past Medical History  Diagnosis Date  . Alcohol abuse     Father is strongly alcoholic with cirrhosis. Rehab stay in 06/2011  . Rotator cuff disorder   . Pancreatitis 06/2011  . Tobacco abuse     Past Surgical History  Procedure Date  . No past surgeries     Family History  Problem Relation Age of Onset  . Alcohol abuse    . Cirrhosis    . Healthy      Social History:  reports that he has been smoking Cigarettes.  He  has a 5 pack-year smoking history. He has quit using smokeless tobacco. He reports that he drinks alcohol. He reports that he uses illicit drugs (Marijuana and Hydrocodone).  Allergies: No Known Allergies  Medications: I have reviewed the patient's current medications.  Results for orders placed during the hospital encounter of 02/19/12 (from the past 48 hour(s))  CBC WITH DIFFERENTIAL     Status: Abnormal   Collection Time   02/19/12  3:00 PM      Component Value Range Comment   WBC 5.0  4.0 - 10.5 K/uL    RBC 5.13  4.22 - 5.81 MIL/uL    Hemoglobin 17.6 (*) 13.0 - 17.0 g/dL    HCT 16.1  09.6 - 04.5 %    MCV 94.7  78.0 - 100.0 fL    MCH 34.3 (*) 26.0 - 34.0 pg    MCHC 36.2 (*) 30.0 - 36.0 g/dL    RDW 40.9  81.1 - 91.4 %    Platelets 264  150 - 400 K/uL    Neutrophils Relative 44  43 - 77 %    Neutro Abs 2.2  1.7 - 7.7 K/uL    Lymphocytes Relative 37  12 - 46 %    Lymphs Abs 1.9  0.7 - 4.0 K/uL    Monocytes Relative 18 (*) 3 -  12 %    Monocytes Absolute 0.9  0.1 - 1.0 K/uL    Eosinophils Relative 0  0 - 5 %    Eosinophils Absolute 0.0  0.0 - 0.7 K/uL    Basophils Relative 1  0 - 1 %    Basophils Absolute 0.0  0.0 - 0.1 K/uL   HEPATIC FUNCTION PANEL     Status: Abnormal   Collection Time   02/19/12  3:00 PM      Component Value Range Comment   Total Protein 8.7 (*) 6.0 - 8.3 g/dL    Albumin 4.8  3.5 - 5.2 g/dL    AST 62 (*) 0 - 37 U/L SLIGHT HEMOLYSIS   ALT 56 (*) 0 - 53 U/L    Alkaline Phosphatase 54  39 - 117 U/L    Total Bilirubin 1.1  0.3 - 1.2 mg/dL    Bilirubin, Direct 0.2  0.0 - 0.3 mg/dL SLIGHT HEMOLYSIS   Indirect Bilirubin 0.9  0.3 - 0.9 mg/dL   LIPASE, BLOOD     Status: Normal   Collection Time   02/19/12  3:00 PM      Component Value Range Comment   Lipase 55  11 - 59 U/L   BASIC METABOLIC PANEL     Status: Normal   Collection Time   02/19/12  3:00 PM      Component Value Range Comment   Sodium 138  135 - 145 mEq/L    Potassium 4.2  3.5 - 5.1 mEq/L     Chloride 97  96 - 112 mEq/L    CO2 22  19 - 32 mEq/L    Glucose, Bld 82  70 - 99 mg/dL    BUN 8  6 - 23 mg/dL    Creatinine, Ser 4.09  0.50 - 1.35 mg/dL    Calcium 9.7  8.4 - 81.1 mg/dL    GFR calc non Af Amer >90  >90 mL/min    GFR calc Af Amer >90  >90 mL/min   LACTATE DEHYDROGENASE     Status: Abnormal   Collection Time   02/19/12  4:22 PM      Component Value Range Comment   LDH 325 (*) 94 - 250 U/L SLIGHT HEMOLYSIS  URINALYSIS, ROUTINE W REFLEX MICROSCOPIC     Status: Abnormal   Collection Time   02/19/12  4:47 PM      Component Value Range Comment   Color, Urine AMBER (*) YELLOW BIOCHEMICALS MAY BE AFFECTED BY COLOR   APPearance CLEAR  CLEAR    Specific Gravity, Urine 1.025  1.005 - 1.030    pH 5.5  5.0 - 8.0    Glucose, UA NEGATIVE  NEGATIVE mg/dL    Hgb urine dipstick NEGATIVE  NEGATIVE    Bilirubin Urine NEGATIVE  NEGATIVE    Ketones, ur 15 (*) NEGATIVE mg/dL    Protein, ur 914 (*) NEGATIVE mg/dL    Urobilinogen, UA 0.2  0.0 - 1.0 mg/dL    Nitrite NEGATIVE  NEGATIVE    Leukocytes, UA NEGATIVE  NEGATIVE   URINE MICROSCOPIC-ADD ON     Status: Abnormal   Collection Time   02/19/12  4:47 PM      Component Value Range Comment   Squamous Epithelial / LPF RARE  RARE    Casts HYALINE CASTS (*) NEGATIVE   CBC     Status: Normal   Collection Time   02/20/12  1:08 AM      Component Value Range  Comment   WBC 5.6  4.0 - 10.5 K/uL    RBC 4.48  4.22 - 5.81 MIL/uL    Hemoglobin 15.2  13.0 - 17.0 g/dL    HCT 16.1  09.6 - 04.5 %    MCV 96.0  78.0 - 100.0 fL    MCH 33.9  26.0 - 34.0 pg    MCHC 35.3  30.0 - 36.0 g/dL    RDW 40.9  81.1 - 91.4 %    Platelets 189  150 - 400 K/uL   CREATININE, SERUM     Status: Normal   Collection Time   02/20/12  1:08 AM      Component Value Range Comment   Creatinine, Ser 0.73  0.50 - 1.35 mg/dL    GFR calc non Af Amer >90  >90 mL/min    GFR calc Af Amer >90  >90 mL/min   ETHANOL     Status: Normal   Collection Time   02/20/12  1:08 AM       Component Value Range Comment   Alcohol, Ethyl (B) <11  0 - 11 mg/dL   URINE RAPID DRUG SCREEN (HOSP PERFORMED)     Status: Abnormal   Collection Time   02/20/12  3:38 AM      Component Value Range Comment   Opiates POSITIVE (*) NONE DETECTED    Cocaine POSITIVE (*) NONE DETECTED    Benzodiazepines NONE DETECTED  NONE DETECTED    Amphetamines NONE DETECTED  NONE DETECTED    Tetrahydrocannabinol NONE DETECTED  NONE DETECTED    Barbiturates NONE DETECTED  NONE DETECTED   COMPREHENSIVE METABOLIC PANEL     Status: Abnormal   Collection Time   02/20/12  5:42 AM      Component Value Range Comment   Sodium 131 (*) 135 - 145 mEq/L DELTA CHECK NOTED   Potassium 3.3 (*) 3.5 - 5.1 mEq/L DELTA CHECK NOTED   Chloride 94 (*) 96 - 112 mEq/L    CO2 25  19 - 32 mEq/L    Glucose, Bld 103 (*) 70 - 99 mg/dL    BUN 8  6 - 23 mg/dL    Creatinine, Ser 7.82  0.50 - 1.35 mg/dL    Calcium 9.0  8.4 - 95.6 mg/dL    Total Protein 6.9  6.0 - 8.3 g/dL    Albumin 3.9  3.5 - 5.2 g/dL    AST 36  0 - 37 U/L    ALT 41  0 - 53 U/L    Alkaline Phosphatase 45  39 - 117 U/L    Total Bilirubin 1.6 (*) 0.3 - 1.2 mg/dL    GFR calc non Af Amer >90  >90 mL/min    GFR calc Af Amer >90  >90 mL/min   CBC     Status: Normal   Collection Time   02/20/12  5:42 AM      Component Value Range Comment   WBC 5.2  4.0 - 10.5 K/uL    RBC 4.29  4.22 - 5.81 MIL/uL    Hemoglobin 14.1  13.0 - 17.0 g/dL    HCT 21.3  08.6 - 57.8 %    MCV 96.0  78.0 - 100.0 fL    MCH 32.9  26.0 - 34.0 pg    MCHC 34.2  30.0 - 36.0 g/dL    RDW 46.9  62.9 - 52.8 %    Platelets 190  150 - 400 K/uL   LIPASE, BLOOD  Status: Normal   Collection Time   02/20/12  5:42 AM      Component Value Range Comment   Lipase 36  11 - 59 U/L     Dg Ribs Unilateral Left  02/20/2012  *RADIOLOGY REPORT*  Clinical Data: Fall, left rib pain  LEFT RIBS - 2 VIEW  Comparison: 09/08/2011  Findings: Four views left ribs submitted. Cardiomediastinal silhouette is stable.  No  acute infiltrate or pleural effusion.  No pulmonary edema.  No left rib fracture is identified.  No diagnostic pneumothorax .  IMPRESSION: No active disease.  No left rib fracture is identified.  Original Report Authenticated By: Natasha Mead, M.D.    ROS Blood pressure 115/89, pulse 62, temperature 98.1 F (36.7 C), temperature source Oral, resp. rate 18, height 5\' 11"  (1.803 m), weight 77.111 kg (170 lb), SpO2 98.00%. Physical Exam  Appearance: on bed  Eye Contact:: Good  Speech: normal  Volume: Normal  Mood: depressed  Affect: ristricted  Thought Process: organized  Orientation: Full  Thought Content: NO AVH  Suicidal Thoughts: No  Homicidal Thoughts: no  Memory: Recent; Poor  Judgement: Impaired  Insight: Lacking  Psychomotor Activity: Normal  Concentration: Fair  Recall: Fair  Akathisia: No  Assessment:  AXIS I: Polysubstance Dep, Mood d/o NOS, r/o substance induced mood d/o  AXIS II: Deferred  AXIS III: see emdical hx  ? ? ? ? ? ? ? AXIS IV: problems related to social environment, problems with access to health care services and problems with primary support group  AXIS V: 50  ?  Treatment Plan/Recommendations:  1.it is difficult to make any definite dx at this time due to his long drug hx. He may have underlying mood d/o.  2. Will recommend drug rehab after d/c. SW can help  3. psy out after d/c  4. Pt is willing to try Seroquel at this time for his mood and and racing thoughts. It can be started at 50 mg QHS and then increase by 50 mg every day up to 300 mg before discharge. It will help with his sleep too  2. Will continue to follow  Wonda Cerise 02/20/2012, 3:57 PM

## 2012-02-20 NOTE — Progress Notes (Signed)
Clinical Social Work Department BRIEF PSYCHOSOCIAL ASSESSMENT 02/20/2012  Patient:  Randall Beck,Randall Beck     Account Number:  [id number]     Admit date:  02/19/2012  Clinical Social Worker:  Leron Croak, CLINICAL SOCIAL WORKER  Date/Time:  02/20/2012 05:09 PM  Referred by:  Physician  Date Referred:  02/20/2012 Referred for  Substance Abuse   Other Referral:   Interview type:  Patient Other interview type:    PSYCHOSOCIAL DATA Living Status:  PARENTS Admitted from facility:   Level of care:   Primary support name:  Blanchard Kelch Primary support relationship to patient:  PARENT Degree of support available:   good    CURRENT CONCERNS Current Concerns  Substance Abuse   Other Concerns:    SOCIAL WORK ASSESSMENT / PLAN CSW met with the Pt to assess for substance abuse concerns. Pt stated that he has been drinking since he was in high school at the age of 85. Pt started binge drinking when he was 26 years old. Pt stated that he has a family history of alcohol abuse (dad). Pt stated that he usually will start a binge when his stress level increases and he is trying to alleviate his anxiety from the stressful event. Pt realizes his alcohol use has severly affected his life and stated that "He is ready for treatment."   Assessment/plan status:  Information/Referral to [facility] Other assessment/ plan:   Information/referral to community resources:   CSW provided resources for drug rehabilitation programs, as well as, mental health resources for supportive/cognitive therapy services.    PATIENT'S/FAMILY'S RESPONSE TO PLAN OF CARE: Pt stated he is appreciative for information given and that he will be calling several agencies to seek help for his excessive alcohol use/addiction.        Leron Croak, LCSWA [employer] [phone]

## 2012-02-21 LAB — CBC
Platelets: 193 10*3/uL (ref 150–400)
RDW: 12.8 % (ref 11.5–15.5)
WBC: 4.7 10*3/uL (ref 4.0–10.5)

## 2012-02-21 LAB — BASIC METABOLIC PANEL
Chloride: 95 mEq/L — ABNORMAL LOW (ref 96–112)
GFR calc Af Amer: >90 mL/min (ref >90)
Potassium: 3.4 mEq/L — ABNORMAL LOW (ref 3.5–5.1)

## 2012-02-21 MED ORDER — HYDROCODONE-ACETAMINOPHEN 10-325 MG PO TABS
1.0000 | ORAL_TABLET | Freq: Once | ORAL | Status: AC
  Administered 2012-02-21: 1 via ORAL
  Filled 2012-02-21: qty 1

## 2012-02-21 MED ORDER — PANTOPRAZOLE SODIUM 40 MG PO TBEC: 40.0000 mg | DELAYED_RELEASE_TABLET | Freq: Two times a day (BID) | ORAL | Status: DC

## 2012-02-21 NOTE — Progress Notes (Signed)
Very confused, disoriented this AM. Sat with him and explained where he was and why. Thoughts all over the place, impulsive. Left his room thinking he was calm and understood his situation, going to order breakfast. However came out into hall with plastic bag packed with his belongings stated he had called his mom to come and get him. Stated he had to leave right now. Explained I needed to call his MD and talk with her first and he said "Why? I didn't do anything. I'm not suicidal. I didn't sign anything." Paged MD but before she could call me back, he became even more insistent on leaving. I got AMA paper and explained what it was and meant. He read over it and stated he understood then signed his name and his mom's name. Went to Engineer, structural and I followed him Public affairs consultant as MD had not yet returned page. While we were walking toward ED MD called back and I explained current situation and she said ok to let him leave AMA.

## 2012-02-23 NOTE — Discharge Summary (Signed)
Patient left AMA with out being seen by a physician.  Randall Beck

## 2012-03-18 ENCOUNTER — Encounter (HOSPITAL_COMMUNITY): Payer: Self-pay | Admitting: Emergency Medicine

## 2012-03-18 ENCOUNTER — Emergency Department (HOSPITAL_COMMUNITY)
Admission: EM | Admit: 2012-03-18 | Discharge: 2012-03-19 | Disposition: A | Discharge status: Home / Self Care | Payer: Self-pay | Attending: Emergency Medicine | Admitting: Emergency Medicine

## 2012-03-18 DIAGNOSIS — W010XXA Fall on same level from slipping, tripping and stumbling without subsequent striking against object, initial encounter: Secondary | ICD-10-CM | POA: Insufficient documentation

## 2012-03-18 DIAGNOSIS — F17200 Nicotine dependence, unspecified, uncomplicated: Secondary | ICD-10-CM | POA: Insufficient documentation

## 2012-03-18 DIAGNOSIS — S0100XA Unspecified open wound of scalp, initial encounter: Secondary | ICD-10-CM | POA: Insufficient documentation

## 2012-03-18 DIAGNOSIS — S0101XA Laceration without foreign body of scalp, initial encounter: Secondary | ICD-10-CM

## 2012-03-18 DIAGNOSIS — F1010 Alcohol abuse, uncomplicated: Secondary | ICD-10-CM | POA: Insufficient documentation

## 2012-03-18 DIAGNOSIS — M549 Dorsalgia, unspecified: Secondary | ICD-10-CM | POA: Insufficient documentation

## 2012-03-18 MED ORDER — OXYCODONE-ACETAMINOPHEN 5-325 MG PO TABS
2.0000 | ORAL_TABLET | Freq: Once | ORAL | Status: AC
  Administered 2012-03-18: 2 via ORAL
  Filled 2012-03-18: qty 2

## 2012-03-18 MED ORDER — KETOROLAC TROMETHAMINE 30 MG/ML IJ SOLN
30.0000 mg | Freq: Once | INTRAMUSCULAR | Status: AC
  Administered 2012-03-18: 30 mg via INTRAVENOUS
  Filled 2012-03-18: qty 1

## 2012-03-18 NOTE — ED Notes (Signed)
Pt hit his head on Monday. Pt states he quit drinking on Sunday. Pt states now he is having severe headaches. Pt states he had a few beers yesterday and fell down and injured his hips and lower back. Pt states he is able to ambulate, but it is painful.

## 2012-03-18 NOTE — ED Notes (Signed)
Pt states Monday he slipped on some grease on the floor and fell  Pt states he hit his head on the corner of the counter and caused a laceration  Pt states since then he has not been feeling right since has been having headaches, dizziness, blurred peripheral vision  Pt states he got up off the floor last night and had burning in his hamstrings and he was unable to get up   Pt states today he has pain in his lower back that radiates down both buttocks and down into his legs  Pt states he is a heavy drinker and has hx of pancreatitis

## 2012-03-19 MED ORDER — HYDROCODONE-ACETAMINOPHEN 5-325 MG PO TABS: 1.0000 | ORAL_TABLET | ORAL | Status: DC | PRN

## 2012-03-19 NOTE — ED Provider Notes (Signed)
History     CSN: [id number]  Arrival date & time 03/18/12  4098   First MD Initiated Contact with Patient 03/18/12 2232      Chief Complaint  Patient presents with  . Hip Pain  . Head Injury    (Consider location/radiation/quality/duration/timing/severity/associated sxs/prior treatment) The history is provided by the patient.   patient reports she's been off alcohol for approximately 5 days except for one beer last night.  Prior to this he was a heavy drinker.  He reports slipping 5 days ago and hitting the right side of his head against a cupboard in the kitchen.  He had no loss consciousness.  His had no nausea or vomiting.  He reports mild headache since then with occasional "dizziness".  He denies chest pain or abdominal pain.  He has no weakness in his upper lower extremities.  He also reports pain in his lower back with radiation down both of his legs.  He continues to be old and related and ambulated in the room during our examination.. he reports chills without documented fever but does presents the ER with fever 101.  He reports some upper respiratory symptoms without pharyngitis.  Denies cough or congestion or shortness of breath  Past Medical History  Diagnosis Date  . Alcohol abuse     Father is strongly alcoholic with cirrhosis. Rehab stay in 06/2011  . Rotator cuff disorder   . Pancreatitis 06/2011  . Tobacco abuse     Past Surgical History  Procedure Date  . No past surgeries     Family History  Problem Relation Age of Onset  . Alcohol abuse    . Cirrhosis    . Healthy      History  Substance Use Topics  . Smoking status: Current Everyday Smoker -- 0.5 packs/day for 10 years    Types: Cigarettes  . Smokeless tobacco: Former Neurosurgeon  . Alcohol Use: 0.0 oz/week     was daily drinker.  drank 10 beers past sunday.      Review of Systems  All other systems reviewed and are negative.    Allergies  Review of patient's allergies indicates no known  allergies.  Home Medications   Current Outpatient Rx  Name Route Sig Dispense Refill  . ACETAMINOPHEN 500 MG PO TABS Oral Take 1,000 mg by mouth every 6 (six) hours as needed.    . IBUPROFEN 200 MG PO TABS Oral Take 800 mg by mouth every 6 (six) hours as needed.    [name] PREGABALIN 300 MG PO CAPS Oral Take 300 mg by mouth 3 (three) times daily.    [name] HYDROCODONE-ACETAMINOPHEN 5-325 MG PO TABS Oral Take 1 tablet by mouth every 4 (four) hours as needed for pain. 15 tablet 0    BP 152/92  Pulse 94  Temp 101 F (38.3 C) (Oral)  Resp 18  SpO2 100%  Physical Exam  Nursing note and vitals reviewed. Constitutional: He is oriented to person, place, and time. He appears well-developed and well-nourished.  HENT:  Head: Normocephalic and atraumatic.       Healing scalp laceration right parietal scalp.  No significant signs of infection.  Eyes: EOM are normal.  Neck: Normal range of motion.  Cardiovascular: Normal rate, regular rhythm, normal heart sounds and intact distal pulses.   Pulmonary/Chest: Effort normal and breath sounds normal. No respiratory distress.  Abdominal: Soft. He exhibits no distension. There is no tenderness.  Musculoskeletal: Normal range of motion.  No thoracic or lumbar spinal tenderness.  5 out of 5 strength in bilateral upper and lower extremity major muscle groups.  Neurological: He is alert and oriented to person, place, and time.  Skin: Skin is warm and dry.  Psychiatric: He has a normal mood and affect. Judgment normal.    ED Course  Procedures (including critical care time)  Labs Reviewed - No data to display No results found.   1. Scalp laceration   2. Back pain       MDM  Patient with delayed presentation of right scalp laceration.  This does not appear infected at this time.  The patient be discharge him with a short course pain medicine.  His back pain is nonspecific and is able to ambulate in the emergency department.  He has a fever of 101  but do not believe the patient has spinal epidural abscess or any other significant pathology as the cause of his fever.  His fevers likely viral in nature.  He denies IV drug use.  The patient's had no vomiting.  He has a normal neurologic exam.  I do not believe there is any indication for CT imaging of his head.  He may have postconcussive symptoms.        Lyanne Co, MD 03/19/12 [id number]

## 2012-03-22 ENCOUNTER — Encounter (HOSPITAL_COMMUNITY): Payer: Self-pay

## 2012-03-22 ENCOUNTER — Emergency Department (HOSPITAL_COMMUNITY)
Admission: EM | Admit: 2012-03-22 | Discharge: 2012-03-22 | Disposition: A | Discharge status: Home / Self Care | Payer: Self-pay | Attending: Emergency Medicine | Admitting: Emergency Medicine

## 2012-03-22 ENCOUNTER — Emergency Department (HOSPITAL_COMMUNITY): Payer: Self-pay

## 2012-03-22 DIAGNOSIS — F17200 Nicotine dependence, unspecified, uncomplicated: Secondary | ICD-10-CM | POA: Insufficient documentation

## 2012-03-22 DIAGNOSIS — S335XXA Sprain of ligaments of lumbar spine, initial encounter: Secondary | ICD-10-CM | POA: Insufficient documentation

## 2012-03-22 DIAGNOSIS — S39012A Strain of muscle, fascia and tendon of lower back, initial encounter: Secondary | ICD-10-CM

## 2012-03-22 DIAGNOSIS — F1010 Alcohol abuse, uncomplicated: Secondary | ICD-10-CM | POA: Insufficient documentation

## 2012-03-22 DIAGNOSIS — R51 Headache: Secondary | ICD-10-CM | POA: Insufficient documentation

## 2012-03-22 DIAGNOSIS — X58XXXA Exposure to other specified factors, initial encounter: Secondary | ICD-10-CM | POA: Insufficient documentation

## 2012-03-22 MED ORDER — PREDNISONE 50 MG PO TABS: 50.0000 mg | ORAL_TABLET | Freq: Every day | ORAL | Status: DC

## 2012-03-22 MED ORDER — OXYCODONE-ACETAMINOPHEN 5-325 MG PO TABS: 1.0000 | ORAL_TABLET | Freq: Four times a day (QID) | ORAL | Status: AC | PRN

## 2012-03-22 MED ORDER — CYCLOBENZAPRINE HCL 10 MG PO TABS: 10.0000 mg | ORAL_TABLET | Freq: Three times a day (TID) | ORAL | Status: DC | PRN

## 2012-03-22 MED ORDER — OXYCODONE-ACETAMINOPHEN 5-325 MG PO TABS: 1.0000 | ORAL_TABLET | Freq: Four times a day (QID) | ORAL | Status: DC | PRN

## 2012-03-22 MED ORDER — HYDROCODONE-ACETAMINOPHEN 5-325 MG PO TABS
1.0000 | ORAL_TABLET | Freq: Once | ORAL | Status: AC
  Administered 2012-03-22: 1 via ORAL
  Filled 2012-03-22: qty 1

## 2012-03-22 MED ORDER — KETOROLAC TROMETHAMINE 60 MG/2ML IM SOLN
60.0000 mg | Freq: Once | INTRAMUSCULAR | Status: AC
  Administered 2012-03-22: 60 mg via INTRAMUSCULAR
  Filled 2012-03-22: qty 2

## 2012-03-22 NOTE — ED Provider Notes (Signed)
History     CSN: [id number]  Arrival date & time 03/22/12  1315   First MD Initiated Contact with Patient 03/22/12 1316      Chief Complaint  Patient presents with  . Fall  . Back Pain  . Headache    (Consider location/radiation/quality/duration/timing/severity/associated sxs/prior treatment) HPI Patient presents with low back pain that began 1 week ago. The patient states that he was here last Friday for eval but his back continued to hurt. The patient denies abd pain, chest pain, SOB, weakness, numbness, nausea, vomiting, or bowel/bladder incontinence. The patient states that the pain medications did seem to help some but not significantly.The patient states that movement and palpation make the pain worse. Past Medical History  Diagnosis Date  . Alcohol abuse     Father is strongly alcoholic with cirrhosis. Rehab stay in 06/2011  . Rotator cuff disorder   . Pancreatitis 06/2011  . Tobacco abuse     Past Surgical History  Procedure Date  . No past surgeries     Family History  Problem Relation Age of Onset  . Alcohol abuse    . Cirrhosis    . Healthy      History  Substance Use Topics  . Smoking status: Current Everyday Smoker -- 0.5 packs/day for 10 years    Types: Cigarettes  . Smokeless tobacco: Former Neurosurgeon  . Alcohol Use: 0.0 oz/week     was daily drinker.  drank 10 beers past sunday.      Review of Systems All other systems negative except as documented in the HPI. All pertinent positives and negatives as reviewed in the HPI.  Allergies  Review of patient's allergies indicates no known allergies.  Home Medications   Current Outpatient Rx  Name Route Sig Dispense Refill  . ACETAMINOPHEN 500 MG PO TABS Oral Take 1,000 mg by mouth every 6 (six) hours as needed.    [name] HYDROCODONE-ACETAMINOPHEN 5-325 MG PO TABS Oral Take 1 tablet by mouth every 6 (six) hours as needed. Pain    . IBUPROFEN 200 MG PO TABS Oral Take 600 mg by mouth every 6 (six) hours as  needed. Pain    . PREGABALIN 300 MG PO CAPS Oral Take 300 mg by mouth 3 (three) times daily.      BP 135/77  Pulse 80  Temp 98.4 F (36.9 C) (Oral)  Resp 16  Ht 5\' 11"  (1.803 m)  Wt 180 lb (81.647 kg)  BMI 25.10 kg/m2  SpO2 100%  Physical Exam  Constitutional: He appears well-developed and well-nourished. No distress.  Cardiovascular: Normal rate and regular rhythm.   Pulmonary/Chest: Effort normal and breath sounds normal.  Musculoskeletal:       Lumbar back: He exhibits tenderness, pain and spasm. He exhibits normal range of motion, no bony tenderness and no deformity.       Back:  Neurological: He has normal strength. No sensory deficit. Coordination and gait normal.  Reflex Scores:      Patellar reflexes are 2+ on the right side and 2+ on the left side.      Achilles reflexes are 2+ on the right side and 2+ on the left side.   ED Course  Procedures (including critical care time)  The patient is stable at this time. He has no neurological deficits and normal reflexes. He has normal gait as well. Told to return here as needed.   MDM         Carlyle Dolly, PA-C 03/22/12  [address]  Jamesetta Orleans [place], PA-C 03/22/12 1546

## 2012-03-22 NOTE — ED Notes (Signed)
Pt presents with no acute distress.  Pt recent visit to ED for presenting complaints.  Here now for reevaluation.  EDPA Thayer Ohm present to evaluate this pt

## 2012-03-22 NOTE — ED Provider Notes (Signed)
Medical screening examination/treatment/procedure(s) were performed by non-physician practitioner and as supervising physician I was immediately available for consultation/collaboration.   Inri Sobieski, MD 03/22/12 1633 

## 2012-03-29 ENCOUNTER — Emergency Department (HOSPITAL_COMMUNITY)
Admission: EM | Admit: 2012-03-29 | Discharge: 2012-04-01 | Disposition: A | Discharge status: Home / Self Care | Payer: Self-pay | Attending: Emergency Medicine | Admitting: Emergency Medicine

## 2012-03-29 ENCOUNTER — Emergency Department (HOSPITAL_COMMUNITY): Payer: Self-pay

## 2012-03-29 ENCOUNTER — Encounter (HOSPITAL_COMMUNITY): Payer: Self-pay

## 2012-03-29 DIAGNOSIS — S91209A Unspecified open wound of unspecified toe(s) with damage to nail, initial encounter: Secondary | ICD-10-CM

## 2012-03-29 DIAGNOSIS — W1789XA Other fall from one level to another, initial encounter: Secondary | ICD-10-CM | POA: Insufficient documentation

## 2012-03-29 DIAGNOSIS — F1010 Alcohol abuse, uncomplicated: Secondary | ICD-10-CM

## 2012-03-29 DIAGNOSIS — Z79899 Other long term (current) drug therapy: Secondary | ICD-10-CM | POA: Insufficient documentation

## 2012-03-29 DIAGNOSIS — F17200 Nicotine dependence, unspecified, uncomplicated: Secondary | ICD-10-CM | POA: Insufficient documentation

## 2012-03-29 DIAGNOSIS — R112 Nausea with vomiting, unspecified: Secondary | ICD-10-CM

## 2012-03-29 DIAGNOSIS — F10939 Alcohol use, unspecified with withdrawal, unspecified: Secondary | ICD-10-CM

## 2012-03-29 DIAGNOSIS — K2920 Alcoholic gastritis without bleeding: Secondary | ICD-10-CM

## 2012-03-29 DIAGNOSIS — S91109A Unspecified open wound of unspecified toe(s) without damage to nail, initial encounter: Secondary | ICD-10-CM | POA: Insufficient documentation

## 2012-03-29 DIAGNOSIS — R109 Unspecified abdominal pain: Secondary | ICD-10-CM

## 2012-03-29 DIAGNOSIS — F10239 Alcohol dependence with withdrawal, unspecified: Secondary | ICD-10-CM | POA: Insufficient documentation

## 2012-03-29 LAB — URINALYSIS, ROUTINE W REFLEX MICROSCOPIC
Glucose, UA: NEGATIVE mg/dL
Hgb urine dipstick: NEGATIVE
Ketones, ur: NEGATIVE mg/dL
Leukocytes, UA: NEGATIVE
pH: 6.5 (ref 5.0–8.0)

## 2012-03-29 LAB — RAPID URINE DRUG SCREEN, HOSP PERFORMED
Amphetamines: NOT DETECTED
Opiates: NOT DETECTED
Tetrahydrocannabinol: NOT DETECTED

## 2012-03-29 LAB — COMPREHENSIVE METABOLIC PANEL
ALT: 25 U/L (ref 0–53)
AST: 41 U/L — ABNORMAL HIGH (ref 0–37)
CO2: 26 mEq/L (ref 19–32)
Chloride: 101 mEq/L (ref 96–112)
GFR calc non Af Amer: >90 mL/min (ref >90)
Sodium: 144 mEq/L (ref 135–145)
Total Bilirubin: 0.3 mg/dL (ref 0.3–1.2)

## 2012-03-29 LAB — LIPASE, BLOOD: Lipase: 34 U/L (ref 11–59)

## 2012-03-29 LAB — CBC
MCH: 33.7 pg (ref 26.0–34.0)
Platelets: 450 10*3/uL — ABNORMAL HIGH (ref 150–400)
RBC: 4.98 MIL/uL (ref 4.22–5.81)
WBC: 6.8 10*3/uL (ref 4.0–10.5)

## 2012-03-29 MED ORDER — ONDANSETRON HCL 4 MG PO TABS: 4.0000 mg | ORAL_TABLET | Freq: Three times a day (TID) | ORAL | Status: DC | PRN

## 2012-03-29 MED ORDER — ACETAMINOPHEN 325 MG PO TABS
650.0000 mg | ORAL_TABLET | ORAL | Status: DC | PRN
  Administered 2012-03-29: 650 mg via ORAL
  Filled 2012-03-29: qty 2

## 2012-03-29 MED ORDER — NICOTINE 21 MG/24HR TD PT24
21.0000 mg | MEDICATED_PATCH | Freq: Every day | TRANSDERMAL | Status: DC
  Administered 2012-03-29 – 2012-03-31 (×3): 21 mg via TRANSDERMAL
  Filled 2012-03-29 (×3): qty 1

## 2012-03-29 MED ORDER — FOLIC ACID 1 MG PO TABS
1.0000 mg | ORAL_TABLET | Freq: Every day | ORAL | Status: DC
  Administered 2012-03-29 – 2012-04-01 (×4): 1 mg via ORAL
  Filled 2012-03-29 (×4): qty 1

## 2012-03-29 MED ORDER — LORAZEPAM 2 MG/ML IJ SOLN
1.0000 mg | Freq: Once | INTRAMUSCULAR | Status: AC
  Administered 2012-03-29: 1 mg via INTRAVENOUS
  Filled 2012-03-29: qty 1

## 2012-03-29 MED ORDER — PREGABALIN 50 MG PO CAPS
300.0000 mg | ORAL_CAPSULE | Freq: Three times a day (TID) | ORAL | Status: DC
  Administered 2012-03-29 – 2012-04-01 (×9): 300 mg via ORAL
  Filled 2012-03-29 (×2): qty 6
  Filled 2012-03-29: qty 4
  Filled 2012-03-29: qty 2
  Filled 2012-03-29: qty 3
  Filled 2012-03-29: qty 2
  Filled 2012-03-29 (×3): qty 6
  Filled 2012-03-29: qty 3
  Filled 2012-03-29: qty 6

## 2012-03-29 MED ORDER — CEPHALEXIN 500 MG PO CAPS
500.0000 mg | ORAL_CAPSULE | Freq: Four times a day (QID) | ORAL | Status: DC
  Administered 2012-03-30 – 2012-04-01 (×10): 500 mg via ORAL
  Filled 2012-03-29 (×10): qty 1

## 2012-03-29 MED ORDER — ADULT MULTIVITAMIN W/MINERALS CH
1.0000 | ORAL_TABLET | Freq: Every day | ORAL | Status: DC
  Administered 2012-03-29 – 2012-04-01 (×4): 1 via ORAL
  Filled 2012-03-29 (×4): qty 1

## 2012-03-29 MED ORDER — CEFAZOLIN SODIUM 1-5 GM-% IV SOLN
1.0000 g | Freq: Once | INTRAVENOUS | Status: AC
  Administered 2012-03-29: 1 g via INTRAVENOUS
  Filled 2012-03-29: qty 50

## 2012-03-29 MED ORDER — LORAZEPAM 1 MG PO TABS
0.0000 mg | ORAL_TABLET | Freq: Two times a day (BID) | ORAL | Status: DC
  Administered 2012-03-31: 1 mg via ORAL

## 2012-03-29 MED ORDER — IBUPROFEN 600 MG PO TABS
600.0000 mg | ORAL_TABLET | Freq: Three times a day (TID) | ORAL | Status: DC | PRN
  Administered 2012-03-29 – 2012-03-31 (×5): 600 mg via ORAL
  Filled 2012-03-29 (×3): qty 1
  Filled 2012-03-29: qty 3
  Filled 2012-03-29: qty 1

## 2012-03-29 MED ORDER — VITAMIN B-1 100 MG PO TABS
100.0000 mg | ORAL_TABLET | Freq: Every day | ORAL | Status: DC
  Administered 2012-03-29 – 2012-04-01 (×4): 100 mg via ORAL
  Filled 2012-03-29 (×4): qty 1

## 2012-03-29 MED ORDER — CEPHALEXIN 500 MG PO CAPS: 500.0000 mg | ORAL_CAPSULE | Freq: Four times a day (QID) | ORAL | Status: DC

## 2012-03-29 MED ORDER — BACITRACIN ZINC 500 UNIT/GM EX OINT
TOPICAL_OINTMENT | CUTANEOUS | Status: AC
  Administered 2012-03-29: 1 via TOPICAL
  Filled 2012-03-29: qty 0.9

## 2012-03-29 MED ORDER — LORAZEPAM 1 MG PO TABS
0.0000 mg | ORAL_TABLET | Freq: Four times a day (QID) | ORAL | Status: AC
  Administered 2012-03-29 – 2012-03-30 (×5): 1 mg via ORAL
  Administered 2012-03-31: 2 mg via ORAL
  Filled 2012-03-29 (×6): qty 1
  Filled 2012-03-29: qty 2
  Filled 2012-03-29: qty 1

## 2012-03-29 MED ORDER — ONDANSETRON HCL 4 MG/2ML IJ SOLN
4.0000 mg | Freq: Once | INTRAMUSCULAR | Status: AC
  Administered 2012-03-29: 4 mg via INTRAVENOUS
  Filled 2012-03-29: qty 2

## 2012-03-29 MED ORDER — OXYCODONE-ACETAMINOPHEN 5-325 MG PO TABS
1.0000 | ORAL_TABLET | Freq: Three times a day (TID) | ORAL | Status: DC | PRN
  Administered 2012-03-30 – 2012-04-01 (×5): 1 via ORAL
  Filled 2012-03-29 (×5): qty 1

## 2012-03-29 MED ORDER — SODIUM CHLORIDE 0.9 % IV BOLUS (SEPSIS)
1000.0000 mL | Freq: Once | INTRAVENOUS | Status: AC
  Administered 2012-03-29: 1000 mL via INTRAVENOUS

## 2012-03-29 MED ORDER — THIAMINE HCL 100 MG/ML IJ SOLN: 100.0000 mg | Freq: Every day | INTRAMUSCULAR | Status: DC

## 2012-03-29 NOTE — ED Notes (Signed)
Pt presents with abdominal pain since this am. Last vomited 1 hour ago- Pt reports HX of pancreatitis. Pt requesting detox and last drink was at 7am

## 2012-03-29 NOTE — ED Notes (Signed)
Pt. Belongings are labeled and locked in the cabinet in his room. Pt. Had grey sweat pants, white t-shirt, black hat, sandals shoes

## 2012-03-29 NOTE — ED Notes (Signed)
Pt complains of abd pain and vomiting since this am, last drink at 6am, hx of pancreatitis, pt wants detox and has refused all of EMS treatments including fluids and nausea medication.

## 2012-03-29 NOTE — ED Provider Notes (Signed)
History     CSN: [id number]  Arrival date & time 03/29/12  1240   First MD Initiated Contact with Patient 03/29/12 1357      Chief Complaint  Patient presents with  . Abdominal Pain  . Emesis  . Medical Clearance    detox   level V caveat due to intoxication.  (Consider location/radiation/quality/duration/timing/severity/associated sxs/prior treatment) Patient is a 26 y.o. male presenting with abdominal pain and vomiting. The history is provided by the patient.  Abdominal Pain The primary symptoms of the illness include abdominal pain, nausea and vomiting.  Emesis  Associated symptoms include abdominal pain.   patient has history of alcoholism. He states his abdominal pain and vomiting since this morning. He last drank this morning. He states feels like his previous pancreatitis. No fevers. No diarrhea. He is not suicidal. He states he is previously had suicidal thoughts but not hurt himself. He drinks about a case of beer a day. His previous substance abuse she states it is not used much else now Is also complaining of an injury to his right great toe. States he hit it when he fell off a truck when he was drunk. He worries it is broken. He also states that his head when it went down. He states he hit himself about the right eye. He has some pain in that area.   Past Medical History  Diagnosis Date  . Alcohol abuse     Father is strongly alcoholic with cirrhosis. Rehab stay in 06/2011  . Rotator cuff disorder   . Pancreatitis 06/2011  . Tobacco abuse     Past Surgical History  Procedure Date  . No past surgeries     Family History  Problem Relation Age of Onset  . Alcohol abuse    . Cirrhosis    . Healthy      History  Substance Use Topics  . Smoking status: Current Everyday Smoker -- 0.5 packs/day for 10 years    Types: Cigarettes  . Smokeless tobacco: Former Neurosurgeon  . Alcohol Use: 0.0 oz/week     was daily drinker.  drank 10 beers past sunday.      Review of  Systems  Unable to perform ROS Gastrointestinal: Positive for nausea, vomiting and abdominal pain.    Allergies  Review of patient's allergies indicates no known allergies.  Home Medications   Current Outpatient Rx  Name Route Sig Dispense Refill  . ACETAMINOPHEN 500 MG PO TABS Oral Take 1,000 mg by mouth every 6 (six) hours as needed. Pain.    [name] HYDROCODONE-ACETAMINOPHEN 5-325 MG PO TABS Oral Take 1 tablet by mouth every 6 (six) hours as needed. Pain    . IBUPROFEN 200 MG PO TABS Oral Take 600 mg by mouth every 6 (six) hours as needed. Pain    . OXYCODONE-ACETAMINOPHEN 5-325 MG PO TABS Oral Take 1 tablet by mouth every 6 (six) hours as needed for pain. 15 tablet 0  . PREGABALIN 300 MG PO CAPS Oral Take 300 mg by mouth 3 (three) times daily.      BP 141/92  Pulse 111  Temp 98.4 F (36.9 C)  Resp 18  SpO2 97%  Physical Exam  Vitals reviewed. Constitutional: He is oriented to person, place, and time. He appears well-developed.  HENT:  Head: Normocephalic.       Tenderness right superior orbital rim. Mild ecchymosis. Extraocular movements intact.  Eyes: Pupils are equal, round, and reactive to light.  Neck: Normal range of motion.  Cardiovascular: Regular rhythm.   Pulmonary/Chest: Effort normal and breath sounds normal.  Abdominal: Soft. Bowel sounds are normal. There is tenderness.       She upper abdominal tenderness without rebound or guarding.  Musculoskeletal:       Tenderness to right foot over the MTP joint. Also tenderness over IP joint. Erythema to toe. Toenail is missing. Some scabbing and granulation tissue.  Neurological: He is alert and oriented to person, place, and time.  Skin: Skin is warm.    ED Course  Procedures (including critical care time)  Labs Reviewed  COMPREHENSIVE METABOLIC PANEL - Abnormal; Notable for the following:    AST 41 (*)     All other components within normal limits  ETHANOL - Abnormal; Notable for the following:    Alcohol,  Ethyl (B) 346 (*)     All other components within normal limits  CBC - Abnormal; Notable for the following:    Platelets 450 (*)     All other components within normal limits  ACETAMINOPHEN LEVEL  URINE RAPID DRUG SCREEN (HOSP PERFORMED)  LIPASE, BLOOD  URINALYSIS, ROUTINE W REFLEX MICROSCOPIC   Dg Foot Complete Right  03/29/2012  *RADIOLOGY REPORT*  Clinical Data: Fall.  Foot pain.  RIGHT FOOT COMPLETE - 3+ VIEW  Comparison: None.  Findings: Anatomic alignment of the right foot.  No fracture.  Soft tissues appear within normal limits.  IMPRESSION: No acute osseous injury.   Original Report Authenticated By: Andreas Newport, M.D.    Ct Maxillofacial Wo Cm  03/29/2012  *RADIOLOGY REPORT*  Clinical Data: History of recent trauma to the right orbital area with a laceration and bruising.  Right orbital pain.  CT MAXILLOFACIAL WITHOUT CONTRAST  Technique:  Multidetector CT imaging of the maxillofacial structures was performed. Multiplanar CT image reconstructions were also generated.  Comparison: No priors.  Findings: No acute displaced facial fractures.  Pterygoid plates are intact.  Mandibular condyles are located bilaterally.  The right globe and retro-orbital soft tissues appear grossly unremarkable.  IMPRESSION: 1.  No acute displaced facial bone fractures or other significant post-traumatic findings noted.   Original Report Authenticated By: Florencia Reasons, M.D.      1. Abdominal pain   2. Nausea and vomiting   3. Alcohol withdrawal   4. Gastritis, alcoholic       MDM  patietn with abdominal pain and vomiting. History of substance abuse and pancreatitis. His laboratories overall reassuring. Patient also is requesting treatment for his alcoholism. He has an infection on his foot. He'll be given antibiotics for it. He appears to be medically cleared for psych treatment at this time.  Juliet Rude. Rubin Payor, MD 03/29/12 [phone]

## 2012-03-30 MED ORDER — LORAZEPAM 1 MG PO TABS
1.0000 mg | ORAL_TABLET | Freq: Four times a day (QID) | ORAL | Status: DC | PRN
  Administered 2012-03-30 – 2012-03-31 (×2): 1 mg via ORAL
  Filled 2012-03-30: qty 1

## 2012-03-30 NOTE — ED Notes (Addendum)
Pt states "I normally drink fifth or 20-24 beers every day, last drink was last night."

## 2012-03-30 NOTE — ED Notes (Signed)
Patient requesting Ativan for shaking informed patient that his ativan was not due until 4 am. Patient up turning TV and legs not shaking. Patient returned back to bed and legs not shaking.

## 2012-03-30 NOTE — ED Notes (Signed)
Pt continues to ask for ativan, has visible tremors when staff in room, on camera pt is without tremors.

## 2012-03-30 NOTE — BH Assessment (Signed)
Assessment Note   Randall Beck is an 26 y.o. male who presents for alcohol detox. Pt began drinking at age 58. Pt drinks either 12 pack, 18 pack beer or one fifth liquor daily and has done so for past yr. Longest period of sobriety is 11 mos. Pt denies SI and HI. No delusions noted. Pt denies AH &VH. Patient reports depressed mood. He endorses severe anxiety. His affect is depressed. He is polite and cooperative. Upon completion of medical detox, pt wants to enroll in [facility] 30 day SA treatment. Current withdrawal symptoms include tremors, fever/chills and nausea. He has no hx of seizures.  Axis I: Alcohol Dependence Axis II: Deferred Axis III:  Past Medical History  Diagnosis Date  . Alcohol abuse     Father is strongly alcoholic with cirrhosis. Rehab stay in 06/2011  . Rotator cuff disorder   . Pancreatitis 06/2011  . Tobacco abuse    Axis IV: economic problems, occupational problems, other psychosocial or environmental problems and problems related to social environment Axis V: 31-40 impairment in reality testing  Past Medical History:  Past Medical History  Diagnosis Date  . Alcohol abuse     Father is strongly alcoholic with cirrhosis. Rehab stay in 06/2011  . Rotator cuff disorder   . Pancreatitis 06/2011  . Tobacco abuse     Past Surgical History  Procedure Date  . No past surgeries     Family History:  Family History  Problem Relation Age of Onset  . Alcohol abuse    . Cirrhosis    . Healthy      Social History:  reports that he has been smoking Cigarettes.  He has a 5 pack-year smoking history. He has quit using smokeless tobacco. He reports that he drinks alcohol. He reports that he uses illicit drugs (Marijuana and Hydrocodone).  Additional Social History:  Alcohol / Drug Use Pain Medications: see PTA meds Prescriptions: see PTA meds Over the Counter: see PTA meds History of alcohol / drug use?: Yes Longest period of sobriety (when/how long): 11  mos Substance #1 Name of Substance 1: alcohol 1 - Age of First Use: 12 1 - Amount (size/oz): 12 pack or 18 pack or one fifth liquor 1 - Frequency: daily 1 - Duration: for one year 1 - Last Use / Amount: 03/28/12 - 12 pack beer  CIWA: CIWA-Ar BP: 126/78 mmHg Pulse Rate: 76  Nausea and Vomiting: no nausea and no vomiting Tactile Disturbances: very mild itching, pins and needles, burning or numbness Tremor: two Auditory Disturbances: not present Paroxysmal Sweats: no sweat visible Visual Disturbances: not present Anxiety: three Headache, Fullness in Head: none present Agitation: somewhat more than normal activity Orientation and Clouding of Sensorium: oriented and can do serial additions CIWA-Ar Total: 7  COWS:    Allergies: No Known Allergies  Home Medications:  (Not in a hospital admission)  OB/GYN Status:  No LMP for male patient.  General Assessment Data Location of Assessment: [facility] ED Living Arrangements: Parent Can pt return to current living arrangement?: Yes Admission Status: Voluntary Is patient capable of signing voluntary admission?: Yes Transfer from: Acute Hospital Referral Source: Self/Family/Friend  Education Status Is patient currently in school?: No Current Grade: na Highest grade of school patient has completed: GED Name of school: na Contact person: na  Risk to self Suicidal Ideation: No Suicidal Intent: No Is patient at risk for suicide?: No Suicidal Plan?: No Access to Means: No What has been your use of drugs/alcohol within  the last 12 months?: daily alcohol Previous Attempts/Gestures: No How many times?: 0  Other Self Harm Risks: na Triggers for Past Attempts:  (na) Intentional Self Injurious Behavior: None Family Suicide History: No Recent stressful life event(s): Other (Comment) (SA) Persecutory voices/beliefs?: No Depression: Yes Depression Symptoms: Insomnia Substance abuse history and/or treatment for substance abuse?: Yes Suicide  prevention information given to non-admitted patients: Not applicable  Risk to Others Homicidal Ideation: No Thoughts of Harm to Others: No Current Homicidal Intent: No Current Homicidal Plan: No Access to Homicidal Means: No Identified Victim: na History of harm to others?: No Assessment of Violence: None Noted Violent Behavior Description: pt calm Does patient have access to weapons?: No Criminal Charges Pending?: No Does patient have a court date: No  Psychosis Hallucinations: None noted Delusions: None noted  Mental Status Report Appear/Hygiene: Other (Comment) (appropriate) Eye Contact: Good Motor Activity: Freedom of movement;Tremors Speech: Logical/coherent Level of Consciousness: Alert Mood: Depressed Affect: Appropriate to circumstance Anxiety Level: Severe Thought Processes: Coherent;Relevant Judgement: Unimpaired Orientation: Person;Place;Time;Situation Obsessive Compulsive Thoughts/Behaviors: None  Cognitive Functioning Concentration: Normal Memory: Remote Intact;Recent Intact IQ: Average Insight: Good Impulse Control: Fair Appetite: Poor Weight Loss: 0  Weight Gain: 0  Sleep: Decreased Total Hours of Sleep: 4  Vegetative Symptoms: None  ADLScreening [facility]) Patient's cognitive ability adequate to safely complete daily activities?: Yes Patient able to express need for assistance with ADLs?: Yes Independently performs ADLs?: Yes (appropriate for developmental age)  Abuse/Neglect [facility]) Physical Abuse: Denies Verbal Abuse: Denies Sexual Abuse: Denies  Prior Inpatient Therapy Prior Inpatient Therapy: Yes Prior Therapy Dates: 4 times in past 8 yrs Prior Therapy Facilty/Provider(s): [facility] Reason for Treatment: SA & detox  Prior Outpatient Therapy Prior Outpatient Therapy: No Prior Therapy Dates: na Prior Therapy Facilty/Provider(s): na Reason for Treatment: na  ADL Screening (condition at time of admission) Patient's  cognitive ability adequate to safely complete daily activities?: Yes Patient able to express need for assistance with ADLs?: Yes Independently performs ADLs?: Yes (appropriate for developmental age) Weakness of Legs: None Weakness of Arms/Hands: None  Home Assistive Devices/Equipment Home Assistive Devices/Equipment: None    Abuse/Neglect Assessment (Assessment to be complete while patient is alone) Physical Abuse: Denies Verbal Abuse: Denies Sexual Abuse: Denies Exploitation of patient/patient's resources: Denies Self-Neglect: Denies Values / Beliefs Cultural Requests During Hospitalization: None Spiritual Requests During Hospitalization: None   Advance Directives (For Healthcare) Advance Directive: Patient does not have advance directive;Patient would not like information    Additional Information 1:1 In Past 12 Months?: No CIRT Risk: No Elopement Risk: No     Disposition:  Disposition Disposition of Patient: Inpatient treatment program;Outpatient treatment  On Site Evaluation by:   Reviewed with Physician:     Donnamarie Rossetti P 03/30/2012 6:22 AM

## 2012-03-30 NOTE — ED Provider Notes (Signed)
Patient on CIWA protocol. Still having some shakes but per nurse, he likes to abuse ativan. He is not tachycardic on the most recent vitals. He also has R big toe pain and abdominal pain with hx of pancreatitis. His admission xray showed no fracture and has normal lipase. He has been tolerating PO recently. Will continue to monitor patient and awake an inpatient detox bed.   Richardean Canal, MD 03/30/12 [phone]

## 2012-03-31 MED ORDER — BACITRACIN ZINC 500 UNIT/GM EX OINT
TOPICAL_OINTMENT | CUTANEOUS | Status: AC
  Administered 2012-03-31: 1
  Filled 2012-03-31: qty 0.9

## 2012-03-31 MED ORDER — SULFAMETHOXAZOLE-TMP DS 800-160 MG PO TABS
1.0000 | ORAL_TABLET | Freq: Two times a day (BID) | ORAL | Status: DC
  Administered 2012-03-31 – 2012-04-01 (×3): 1 via ORAL
  Filled 2012-03-31 (×3): qty 1

## 2012-03-31 NOTE — ED Notes (Signed)
Patient right big toe soaked.

## 2012-03-31 NOTE — BHH Counselor (Signed)
Pt's information has been faxed to [facility] for review.

## 2012-03-31 NOTE — ED Notes (Signed)
Patient reports anxious even though CIWA score is 1. Patient medicated per prn order.

## 2012-03-31 NOTE — ED Provider Notes (Signed)
Patient is here for alcohol abuse. He has a history of pancreatitis and has been complaining of abdominal pain however his lipase is normal and he had an x-ray that was normal. Patient states when he quits drinking "my pancreas is well as I can feel it". Patient also A. Boles the toenail of his right great toe and he has some mild redness around where the  nail bed would be placed with a linear abrasion proximal to the nailbed. Patient states he is already on antibiotics. He states he feels "light duty" he does not have any tremor. He also states he hit his head he has a headache. Patient states he was drinking a fifth to a half a gallon of liquor.  Devoria Albe, MD, FACEP   Ward Givens, MD 03/31/12 [phone]

## 2012-03-31 NOTE — BHH Counselor (Signed)
D/C in the am to follow up with [facility] for residential services Tuesday, April 05, 2012 @ 8am.  Pt made aware of discharge plan and agrees. Referral information with [facility] phone number, address, time of appointment, etc. placed in patient's chart.

## 2012-04-01 MED ORDER — SULFAMETHOXAZOLE-TRIMETHOPRIM 800-160 MG PO TABS: 1.0000 | ORAL_TABLET | Freq: Two times a day (BID) | ORAL | Status: AC

## 2012-04-01 MED ORDER — CEPHALEXIN 500 MG PO CAPS: 500.0000 mg | ORAL_CAPSULE | Freq: Three times a day (TID) | ORAL | Status: AC

## 2012-04-01 MED ORDER — GABAPENTIN 300 MG PO CAPS: ORAL_CAPSULE | ORAL | Status: DC

## 2012-04-01 NOTE — ED Notes (Signed)
Pharmacy notified pyxis out of lyrica, awaiting lyrica to d/c pt.

## 2012-04-01 NOTE — ED Provider Notes (Signed)
Pt is alert and in NAD. Still trying to get pain medications. Advised he is going to rehab/detox and he will not be able to be on narcotics. His right great toes was inspected and looks better than yesterday, the redness is gone.  Pt has been accepted at [facility] and is being discharged this morning. Pt states he can't afford his lyrica for chronic pain and wants to be put on gabapentin.  Devoria Albe, MD, FACEP   Ward Givens, MD 04/01/12 [phone]

## 2012-04-01 NOTE — BHH Counselor (Signed)
Referred to [facility]. EDP in agreement with d/c plan. Provided pt with number and address of facility.

## 2012-04-27 ENCOUNTER — Encounter (HOSPITAL_BASED_OUTPATIENT_CLINIC_OR_DEPARTMENT_OTHER): Payer: Self-pay | Admitting: *Deleted

## 2012-04-27 ENCOUNTER — Emergency Department (HOSPITAL_BASED_OUTPATIENT_CLINIC_OR_DEPARTMENT_OTHER)
Admission: EM | Admit: 2012-04-27 | Discharge: 2012-04-27 | Disposition: A | Discharge status: Home / Self Care | Payer: Self-pay | Attending: Emergency Medicine | Admitting: Emergency Medicine

## 2012-04-27 DIAGNOSIS — M719 Bursopathy, unspecified: Secondary | ICD-10-CM | POA: Insufficient documentation

## 2012-04-27 DIAGNOSIS — M75100 Unspecified rotator cuff tear or rupture of unspecified shoulder, not specified as traumatic: Secondary | ICD-10-CM

## 2012-04-27 DIAGNOSIS — M67919 Unspecified disorder of synovium and tendon, unspecified shoulder: Secondary | ICD-10-CM | POA: Insufficient documentation

## 2012-04-27 DIAGNOSIS — M25519 Pain in unspecified shoulder: Secondary | ICD-10-CM | POA: Insufficient documentation

## 2012-04-27 MED ORDER — LIDOCAINE HCL (PF) 1 % IJ SOLN
4.0000 mL | Freq: Once | INTRAMUSCULAR | Status: DC
  Filled 2012-04-27: qty 5

## 2012-04-27 MED ORDER — TRIAMCINOLONE ACETONIDE 40 MG/ML IJ SUSP
40.0000 mg | Freq: Once | INTRAMUSCULAR | Status: DC
  Filled 2012-04-27: qty 5

## 2012-04-27 MED ORDER — BUPIVACAINE HCL (PF) 0.5 % IJ SOLN
10.0000 mL | Freq: Once | INTRAMUSCULAR | Status: DC
  Filled 2012-04-27: qty 10

## 2012-04-27 MED ORDER — GABAPENTIN 300 MG PO CAPS: ORAL_CAPSULE | ORAL | Status: DC

## 2012-04-27 NOTE — ED Notes (Signed)
Pt c/o right shoulder pain x 3 days w/o recent injury

## 2012-05-04 NOTE — ED Provider Notes (Signed)
History     CSN: [id number]  Arrival date & time 04/27/12  1426   First MD Initiated Contact with Patient 04/27/12 1504      Chief Complaint  Patient presents with  . Shoulder Pain    (Consider location/radiation/quality/duration/timing/severity/associated sxs/prior treatment) HPI Randall Beck is a 26 y.o. male presenting with right shoulder pain x3 days and has not had a recent injury. Patient says he has had this shoulder pain for a long time ever since he pitched baseball in high school. He describes having had a rotator cuff tear but never had it fixed. He does also have a history of pancreatitis and alcohol abuse and is currently in rehabilitation. He says his shoulder pain is 10 out of 10 at night and currently is 5/10, aching the aching and dull and sometimes sharp and stabbing when he moves his arm and correctly, he also describes weakness when lifting his arm laterally.   Past Medical History  Diagnosis Date  . Alcohol abuse     Father is strongly alcoholic with cirrhosis. Rehab stay in 06/2011  . Rotator cuff disorder   . Pancreatitis 06/2011  . Tobacco abuse     Past Surgical History  Procedure Date  . No past surgeries     Family History  Problem Relation Age of Onset  . Alcohol abuse    . Cirrhosis    . Healthy      History  Substance Use Topics  . Smoking status: Current Every Day Smoker -- 0.5 packs/day for 10 years    Types: Cigarettes  . Smokeless tobacco: Former Neurosurgeon  . Alcohol Use: No      Review of Systems At least 10pt or greater review of systems completed and are negative except where specified in the HPI.  Allergies  Review of patient's allergies indicates no known allergies.  Home Medications   Current Outpatient Rx  Name Route Sig Dispense Refill  . ACETAMINOPHEN 500 MG PO TABS Oral Take 1,000 mg by mouth every 6 (six) hours as needed. Pain.    [name] GABAPENTIN 300 MG PO CAPS  Take 1 po the first day, 1 po BID the second day then 1 po  TID 90 capsule 0  . HYDROCODONE-ACETAMINOPHEN 5-325 MG PO TABS Oral Take 1 tablet by mouth every 6 (six) hours as needed. Pain    . IBUPROFEN 200 MG PO TABS Oral Take 600 mg by mouth every 6 (six) hours as needed. Pain    . PREGABALIN 300 MG PO CAPS Oral Take 300 mg by mouth 3 (three) times daily.      BP 129/74  Pulse 85  Temp 98.5 F (36.9 C) (Oral)  Resp 16  Ht 5\' 11"  (1.803 m)  Wt 180 lb (81.647 kg)  BMI 25.10 kg/m2  SpO2 98%  Physical Exam  Nursing notes reviewed.  Electronic medical record reviewed. VITAL SIGNS:   Filed Vitals:   04/27/12 1432  BP: 129/74  Pulse: 85  Temp: 98.5 F (36.9 C)  TempSrc: Oral  Resp: 16  Height: 5\' 11"  (1.803 m)  Weight: 180 lb (81.647 kg)  SpO2: 98%   CONSTITUTIONAL: Awake, oriented, appears non-toxic HENT: Atraumatic, normocephalic, oral mucosa pink and moist, airway patent. Nares patent without drainage. External ears normal. EYES: Conjunctiva clear, EOMI, PERRLA NECK: Trachea midline, non-tender, supple CARDIOVASCULAR: Normal heart rate, Normal rhythm, No murmurs, rubs, gallops PULMONARY/CHEST: Clear to auscultation, no rhonchi, wheezes, or rales. Symmetrical breath sounds. Non-tender. ABDOMINAL: Non-distended, soft, non-tender -  no rebound or guarding.  BS normal. NEUROLOGIC: Non-focal, moving all four extremities, no gross sensory or motor deficits. EXTREMITIES: No clubbing, cyanosis, or edema. Jobe's test positive, supraspinatus pain to palpation, patient has difficulty maintaining his right arm abducted against gravity. SKIN: Warm, Dry, No erythema, No rash  ED Course  Injection of subacromial bursa Performed by: Jones Skene Authorized by: Jones Skene Consent: Verbal consent obtained. Risks and benefits: risks, benefits and alternatives were discussed Consent given by: patient Patient understanding: patient states understanding of the procedure being performed Patient identity confirmed: verbally with patient and arm  band Preparation: Patient was prepped and draped in the usual sterile fashion. Local anesthesia used: no Patient sedated: no Patient tolerance: Patient tolerated the procedure well with no immediate complications.   (including critical care time)  Labs Reviewed - No data to display No results found.   1. Rotator cuff tear     MDM  Randall Beck is a 26 y.o. male presenting with rotator cuff tear. Patient's presentation is not consistent with a traumatic injury, fracture or dislocation. The shoulder does not appear deformed. Do not think there is any indication to perform imaging tests at this time. Also discussed not putting the patient on narcotic pain relievers as he is a recovering addict In addition he cannot receive these at the rehabilitation facility he is currently enrolled at. I have discussed performing a local injection to the shoulder for pain relief.  Discussed this patient's case with orthopedics on-call-he said he is free to followup with them in the office and suggested doing a subacromial bursal injection for pain relief. I did have a long discussion with the patient concerning risks and benefits of performing a subacromial bursal injection including infection, bleeding, worsening of his condition with respect to pain, breakdown of tissue or further breakdown of tendons or ligaments. Patient understands and accepts risks and benefits of procedures and understands there are no guarantees made for his pain relief or safety.   Performed injection of the subacromial space, patient had some pain relief. Procedure tolerated well. He'll be discharged home stable and good condition.  I explained the diagnosis and have given explicit precautions to return to the ER including worsening shoulder pain, fevers, chills or any other new or worsening symptoms. The patient understands and accepts the medical plan as it's been dictated and I have answered their questions. Discharge instructions  concerning home care and prescriptions have been given.  The patient is STABLE and is discharged to home in good condition.           Jones Skene, MD 05/04/12 1512

## 2012-05-18 ENCOUNTER — Emergency Department (HOSPITAL_COMMUNITY)
Admission: EM | Admit: 2012-05-18 | Discharge: 2012-05-18 | Disposition: A | Discharge status: Home / Self Care | Payer: Self-pay

## 2012-05-18 NOTE — ED Notes (Signed)
Pt called 3 times, friend went outside to get him because he was on the phone. He refused to hang up the phone and stayed outside.

## 2012-05-18 NOTE — ED Notes (Signed)
Pt called again and still did not come back inside.

## 2012-05-18 NOTE — ED Notes (Signed)
Patient has been called for triage multiple times and not found in the waiting room.

## 2012-05-31 ENCOUNTER — Encounter (HOSPITAL_COMMUNITY): Payer: Self-pay | Admitting: Emergency Medicine

## 2012-05-31 ENCOUNTER — Emergency Department (HOSPITAL_COMMUNITY)
Admission: EM | Admit: 2012-05-31 | Discharge: 2012-05-31 | Disposition: A | Discharge status: Home / Self Care | Payer: Self-pay | Attending: Emergency Medicine | Admitting: Emergency Medicine

## 2012-05-31 DIAGNOSIS — K089 Disorder of teeth and supporting structures, unspecified: Secondary | ICD-10-CM | POA: Insufficient documentation

## 2012-05-31 DIAGNOSIS — F17200 Nicotine dependence, unspecified, uncomplicated: Secondary | ICD-10-CM | POA: Insufficient documentation

## 2012-05-31 DIAGNOSIS — K0889 Other specified disorders of teeth and supporting structures: Secondary | ICD-10-CM

## 2012-05-31 DIAGNOSIS — Z8719 Personal history of other diseases of the digestive system: Secondary | ICD-10-CM | POA: Insufficient documentation

## 2012-05-31 DIAGNOSIS — R51 Headache: Secondary | ICD-10-CM | POA: Insufficient documentation

## 2012-05-31 DIAGNOSIS — F1010 Alcohol abuse, uncomplicated: Secondary | ICD-10-CM | POA: Insufficient documentation

## 2012-05-31 DIAGNOSIS — Z8739 Personal history of other diseases of the musculoskeletal system and connective tissue: Secondary | ICD-10-CM | POA: Insufficient documentation

## 2012-05-31 MED ORDER — HYDROCODONE-ACETAMINOPHEN 5-325 MG PO TABS: 1.0000 | ORAL_TABLET | ORAL | Status: DC | PRN

## 2012-05-31 NOTE — ED Provider Notes (Signed)
History     CSN: [id number]  Arrival date & time 05/31/12  1514   First MD Initiated Contact with Patient 05/31/12 1600      Chief Complaint  Patient presents with  . Dental Pain    (Consider location/radiation/quality/duration/timing/severity/associated sxs/prior treatment) HPI Comments: Patient presents with uncontrolled dental pain.  States he has been diagnosed with impacted wisdom teeth on the left side and has an appointment Monday to have his teeth extracted.  Was given amoxicillin and 800mg  ibuprofen by the dentist and the pain is uncontrolled.  Pain is throbbing, constant, worse with eating/chewing.  States he is still sober from alcohol but he is worried he will start drinking again if he cannot get his pain under control.  Is not opposed to using narcotics.  Denies fevers, sore throat, difficulty swallowing or breathing.    Patient is a 26 y.o. male presenting with tooth pain. The history is provided by the patient.  Dental PainThe primary symptoms include headaches. Primary symptoms do not include fever or sore throat.  Additional symptoms do not include: trouble swallowing.    Past Medical History  Diagnosis Date  . Alcohol abuse     Father is strongly alcoholic with cirrhosis. Rehab stay in 06/2011  . Rotator cuff disorder   . Pancreatitis 06/2011  . Tobacco abuse     Past Surgical History  Procedure Date  . No past surgeries     Family History  Problem Relation Age of Onset  . Alcohol abuse    . Cirrhosis    . Healthy      History  Substance Use Topics  . Smoking status: Current Every Day Smoker -- 0.5 packs/day for 10 years    Types: Cigarettes  . Smokeless tobacco: Former Neurosurgeon  . Alcohol Use: No      Review of Systems  Constitutional: Negative for fever and chills.  HENT: Positive for dental problem. Negative for sore throat and trouble swallowing.   Neurological: Positive for headaches.    Allergies  Review of patient's allergies indicates  no known allergies.  Home Medications   Current Outpatient Rx  Name  Route  Sig  Dispense  Refill  . GABAPENTIN 300 MG PO CAPS   Oral   Take 300 mg by mouth. Take 1 po the first day, 1 po BID the second day then 1 po TID         . IBUPROFEN 800 MG PO TABS   Oral   Take 800 mg by mouth every 8 (eight) hours as needed. Pain           BP 120/71  Pulse 87  Temp 98.2 F (36.8 C) (Oral)  Resp 16  SpO2 100%  Physical Exam  Nursing note and vitals reviewed. Constitutional: He appears well-developed and well-nourished. No distress.  HENT:  Head: Normocephalic and atraumatic.  Mouth/Throat: Uvula is midline and oropharynx is clear and moist. Mucous membranes are not dry. No uvula swelling. No oropharyngeal exudate, posterior oropharyngeal edema or tonsillar abscesses.    Eyes: Conjunctivae normal are normal.  Neck: Neck supple.  Pulmonary/Chest: Effort normal.  Neurological: He is alert.  Skin: He is not diaphoretic.    ED Course  Procedures (including critical care time)  Labs Reviewed - No data to display No results found.   1. Pain, dental     MDM  Pt with dental pain.   No apparent infection or abscess.  Pt has dental follow up scheduled for next week.  Is on amoxicillin and ibuprofen at home.  I have added norco (#10 dispensed).  Discussed plan with patient, dental resources given.  Pt verbalizes understanding and agrees with plan.   Pt given return precautions.          [place], [place] 05/31/12 1722

## 2012-05-31 NOTE — ED Notes (Signed)
Pt c/o pain from wisdom teeth on left side.  Pt was told that they are "impacted".  Pt is scheduled to have them removed on Monday.  Pain 8/10.

## 2012-05-31 NOTE — Progress Notes (Signed)
[facility] ED CM noted 6 [facility] ed visits in last 6 months CM referred pt to [facility] coordinator to assist with providing resources for alternate pcp and health reform resources.

## 2012-05-31 NOTE — ED Provider Notes (Signed)
Medical screening examination/treatment/procedure(s) were performed by non-physician practitioner and as supervising physician I was immediately available for consultation/collaboration.   Gerhard Munch, MD 05/31/12 1730

## 2012-09-04 ENCOUNTER — Emergency Department (HOSPITAL_COMMUNITY)
Admission: EM | Admit: 2012-09-04 | Discharge: 2012-09-04 | Disposition: A | Discharge status: Home / Self Care | Payer: Self-pay | Attending: Emergency Medicine | Admitting: Emergency Medicine

## 2012-09-04 ENCOUNTER — Encounter (HOSPITAL_COMMUNITY): Payer: Self-pay | Admitting: Emergency Medicine

## 2012-09-04 DIAGNOSIS — M67919 Unspecified disorder of synovium and tendon, unspecified shoulder: Secondary | ICD-10-CM | POA: Insufficient documentation

## 2012-09-04 DIAGNOSIS — M67911 Unspecified disorder of synovium and tendon, right shoulder: Secondary | ICD-10-CM

## 2012-09-04 DIAGNOSIS — F17200 Nicotine dependence, unspecified, uncomplicated: Secondary | ICD-10-CM | POA: Insufficient documentation

## 2012-09-04 DIAGNOSIS — R29898 Other symptoms and signs involving the musculoskeletal system: Secondary | ICD-10-CM | POA: Insufficient documentation

## 2012-09-04 DIAGNOSIS — Z79899 Other long term (current) drug therapy: Secondary | ICD-10-CM | POA: Insufficient documentation

## 2012-09-04 DIAGNOSIS — M719 Bursopathy, unspecified: Secondary | ICD-10-CM | POA: Insufficient documentation

## 2012-09-04 MED ORDER — TRIAMCINOLONE ACETONIDE 40 MG/ML IJ SUSP
40.0000 mg | Freq: Once | INTRAMUSCULAR | Status: AC
  Administered 2012-09-04: 40 mg via INTRAMUSCULAR
  Filled 2012-09-04: qty 1

## 2012-09-04 MED ORDER — DOXYLAMINE SUCCINATE (SLEEP) 25 MG PO TABS: 25.0000 mg | ORAL_TABLET | Freq: Every evening | ORAL | Status: DC | PRN

## 2012-09-04 MED ORDER — LIDOCAINE HCL (PF) 1 % IJ SOLN
5.0000 mL | Freq: Once | INTRAMUSCULAR | Status: AC
  Administered 2012-09-04: 5 mL
  Filled 2012-09-04: qty 5

## 2012-09-04 MED ORDER — LIDOCAINE-PRILOCAINE 2.5-2.5 % EX CREA
TOPICAL_CREAM | Freq: Once | CUTANEOUS | Status: AC
  Administered 2012-09-04: 05:00:00 via TOPICAL
  Filled 2012-09-04: qty 5

## 2012-09-04 NOTE — ED Notes (Signed)
Pt c/o R shoulder pain, known rotator cuff tear unrepaired. Pt states pain increased last 2 weeks, worsening tonight.

## 2012-09-04 NOTE — ED Provider Notes (Signed)
Medical screening examination/treatment/procedure(s) were conducted as a shared visit with non-physician practitioner(s) and myself.  I personally evaluated the patient during the encounter Randall Beck, M.D.  Randall Beck is a 27 y.o. male presenting with right shoulder pain. Patient's pain is severe, he's noticed over the last couple of days getting worse since keeping her up at night. This is worse on any movement. Is not alleviated by ibuprofen. Patient is recovering alcoholic, says that he has considered drinking to dull the pain. I saw this patient on October 2 at bedside her [facility] and gave him a subacromial injection into his right shoulder which he says has lasted up until recently. Patient is also recently become employed but still does not have insurance.   At least 10pt or greater review of systems completed and are negative except where specified in the HPI.   VITAL SIGNS:   Filed Vitals:   09/04/12 0253  BP: 127/78  Pulse: 79  Temp: 98.1 F (36.7 C)  Resp: 18   CONSTITUTIONAL: Awake, oriented, appears non-toxic HENT: Atraumatic, normocephalic, oral mucosa pink and moist, airway patent. Nares patent without drainage. External ears normal. EYES: Conjunctiva clear, EOMI, PERRLA NECK: Trachea midline, non-tender, supple CARDIOVASCULAR: Normal heart rate, Normal rhythm, No murmurs, rubs, gallops PULMONARY/CHEST: Clear to auscultation, no rhonchi, wheezes, or rales. Symmetrical breath sounds. Non-tender. ABDOMINAL: Non-distended, soft, non-tender - no rebound or guarding.  BS normal. NEUROLOGIC: Non-focal, moving all four extremities, no gross sensory or motor deficits. EXTREMITIES: No clubbing, cyanosis, or edema. Holding right arm internally rotated and adducted. Any movement at the shoulder joint causes excruciating pain. There is no atrophy and no gross abnormality of the shoulder joint. SKIN: Warm, Dry, No erythema, No rash  [name] is a 27 y.o. male  presents with painful shoulder on the right, I previously injected this patient's shoulder and her had good relief.  See procedure note below. Patient was cautioned and gave consent for this procedure may cause damage to adjacent structures, infection to the joint, or bleeding. Patient understands and accepts the risks and benefits, understands there are no guarantees with the procedure and agreed with it and wishes to proceed with the procedure.   Procedure note: Shoulder injection Indication shoulder pain suggestive of subacromial bursitis versus rotator cuff pain. Patient was consented verbally and with the written consent. EMLA cream was placed on the injection site in the posterior aspect of the shoulder.  A 5 cc syringe was filled what 1 mL containing 40 mg of Kenalog and diluted with 4 cc of 1% lidocaine without epinephrine.  The lateral edge of the scapula at the acromion was palpated, and a site was chosen 1 cm inferior, and 4 there is a small depression the needle was angled toward the coracoid process at an upward angle.  Needle was advanced until it was in the subacromial space, aspirated revealing no fluid pullback or blood, and the 5 cc of Kenalog and lidocaine were injected into the shoulder.  Patient tolerated the procedure well with no immediate complications.  Randall Skene, MD 09/04/12 [phone]

## 2012-09-04 NOTE — ED Provider Notes (Signed)
History     CSN: [id number]  Arrival date & time 09/04/12  0242   First MD Initiated Contact with Patient 09/04/12 0404      Chief Complaint  Patient presents with  . Shoulder Pain    (Consider location/radiation/quality/duration/timing/severity/associated sxs/prior treatment) HPI Comments: Patient with chronic R shoulder pain has known rotator cuff tear had steroid injection 10/13 with good pain relief until 2 weeks ago when the pain returned has been taking OTC medications without relief   Patient is a 27 y.o. male presenting with shoulder pain. The history is provided by the patient.  Shoulder Pain This is a recurrent problem. The current episode started 1 to 4 weeks ago. The problem occurs constantly. The problem has been gradually improving. Associated symptoms include arthralgias. Pertinent negatives include no chills, fever, myalgias, numbness or weakness. The symptoms are aggravated by exertion. He has tried NSAIDs for the symptoms. The treatment provided no relief.    Past Medical History  Diagnosis Date  . Alcohol abuse     Father is strongly alcoholic with cirrhosis. Rehab stay in 06/2011  . Rotator cuff disorder   . Pancreatitis 06/2011  . Tobacco abuse     Past Surgical History  Procedure Laterality Date  . No past surgeries      Family History  Problem Relation Age of Onset  . Alcohol abuse    . Cirrhosis    . Healthy      History  Substance Use Topics  . Smoking status: Current Every Day Smoker -- 0.50 packs/day for 10 years    Types: Cigarettes  . Smokeless tobacco: Former Neurosurgeon  . Alcohol Use: No      Review of Systems  Constitutional: Negative for fever and chills.  HENT: Negative.   Eyes: Negative.   Respiratory: Negative.   Cardiovascular: Negative.   Genitourinary: Negative.   Musculoskeletal: Positive for arthralgias. Negative for myalgias.  Skin: Negative.   Neurological: Negative for weakness and numbness.    Allergies  Review of  patient's allergies indicates no known allergies.  Home Medications   Current Outpatient Rx  Name  Route  Sig  Dispense  Refill  . celecoxib (CELEBREX) 200 MG capsule   Oral   Take 200 mg by mouth 2 (two) times daily.         [name] ibuprofen (ADVIL,MOTRIN) 200 MG tablet   Oral   Take 800 mg by mouth every 6 (six) hours as needed for pain (for pain).         [name] doxylamine, Sleep, (UNISOM) 25 MG tablet   Oral   Take 1 tablet (25 mg total) by mouth at bedtime as needed for sleep.   20 tablet   0     BP 127/78  Pulse 79  Temp(Src) 98.1 F (36.7 C) (Oral)  Resp 18  Ht 5\' 11"  (1.803 m)  Wt 185 lb (83.915 kg)  BMI 25.81 kg/m2  SpO2 97%  Physical Exam  Constitutional: He appears well-developed and well-nourished.  HENT:  Head: Normocephalic and atraumatic.  Eyes: Pupils are equal, round, and reactive to light.  Neck: Normal range of motion.  Cardiovascular: Normal rate.   Pulmonary/Chest: Effort normal.  Musculoskeletal: He exhibits tenderness. He exhibits no edema.       Right shoulder: He exhibits decreased range of motion and tenderness. He exhibits no swelling, normal pulse and normal strength.    ED Course  Procedures (including critical care time)  Labs Reviewed - No data to display  No results found.   1. Rotator cuff disorder, right       MDM   Emla applied to posterior R shoulder DR. Bonk injected the R shoulder joint with lidocaine and Kenalog        Arman Filter, NP 09/04/12 [phone]

## 2012-11-02 ENCOUNTER — Emergency Department (HOSPITAL_COMMUNITY): Payer: Self-pay

## 2012-11-02 ENCOUNTER — Emergency Department (HOSPITAL_COMMUNITY)
Admission: EM | Admit: 2012-11-02 | Discharge: 2012-11-02 | Disposition: A | Discharge status: Home / Self Care | Payer: Self-pay | Attending: Emergency Medicine | Admitting: Emergency Medicine

## 2012-11-02 DIAGNOSIS — R61 Generalized hyperhidrosis: Secondary | ICD-10-CM | POA: Insufficient documentation

## 2012-11-02 DIAGNOSIS — K859 Acute pancreatitis without necrosis or infection, unspecified: Secondary | ICD-10-CM | POA: Insufficient documentation

## 2012-11-02 DIAGNOSIS — R110 Nausea: Secondary | ICD-10-CM | POA: Insufficient documentation

## 2012-11-02 DIAGNOSIS — Z8739 Personal history of other diseases of the musculoskeletal system and connective tissue: Secondary | ICD-10-CM | POA: Insufficient documentation

## 2012-11-02 DIAGNOSIS — F17200 Nicotine dependence, unspecified, uncomplicated: Secondary | ICD-10-CM | POA: Insufficient documentation

## 2012-11-02 LAB — COMPREHENSIVE METABOLIC PANEL
ALT: 694 U/L — ABNORMAL HIGH (ref 0–53)
Alkaline Phosphatase: 54 U/L (ref 39–117)
BUN: 11 mg/dL (ref 6–23)
CO2: 25 mEq/L (ref 19–32)
Chloride: 102 mEq/L (ref 96–112)
GFR calc Af Amer: >90 mL/min (ref >90)
Glucose, Bld: 94 mg/dL (ref 70–99)
Potassium: 3.8 mEq/L (ref 3.5–5.1)
Total Bilirubin: 0.5 mg/dL (ref 0.3–1.2)

## 2012-11-02 LAB — CBC WITH DIFFERENTIAL/PLATELET
Hemoglobin: 15.6 g/dL (ref 13.0–17.0)
Lymphocytes Relative: 42 % (ref 12–46)
Lymphs Abs: 1.9 10*3/uL (ref 0.7–4.0)
MCH: 31.7 pg (ref 26.0–34.0)
Monocytes Relative: 23 % — ABNORMAL HIGH (ref 3–12)
Neutro Abs: 1.5 10*3/uL — ABNORMAL LOW (ref 1.7–7.7)
Neutrophils Relative %: 34 % — ABNORMAL LOW (ref 43–77)
RBC: 4.92 MIL/uL (ref 4.22–5.81)
WBC: 4.4 10*3/uL (ref 4.0–10.5)

## 2012-11-02 LAB — LIPASE, BLOOD: Lipase: 89 U/L — ABNORMAL HIGH (ref 11–59)

## 2012-11-02 MED ORDER — HYDROMORPHONE HCL PF 1 MG/ML IJ SOLN
1.0000 mg | Freq: Once | INTRAMUSCULAR | Status: AC
  Administered 2012-11-02: 1 mg via INTRAVENOUS
  Filled 2012-11-02: qty 1

## 2012-11-02 MED ORDER — SODIUM CHLORIDE 0.9 % IV SOLN
1000.0000 mL | Freq: Once | INTRAVENOUS | Status: AC
  Administered 2012-11-02: 1000 mL via INTRAVENOUS

## 2012-11-02 MED ORDER — HYDROCODONE-ACETAMINOPHEN 5-325 MG PO TABS: 1.0000 | ORAL_TABLET | Freq: Three times a day (TID) | ORAL | Status: DC | PRN

## 2012-11-02 MED ORDER — HYDROMORPHONE HCL PF 1 MG/ML IJ SOLN
1.0000 mg | INTRAMUSCULAR | Status: DC | PRN
  Administered 2012-11-02: 1 mg via INTRAVENOUS
  Filled 2012-11-02: qty 1

## 2012-11-02 MED ORDER — HYDROCODONE-ACETAMINOPHEN 5-325 MG PO TABS
1.0000 | ORAL_TABLET | Freq: Once | ORAL | Status: AC
  Administered 2012-11-02: 1 via ORAL
  Filled 2012-11-02: qty 1

## 2012-11-02 MED ORDER — ONDANSETRON HCL 4 MG/2ML IJ SOLN
4.0000 mg | Freq: Once | INTRAMUSCULAR | Status: AC
  Administered 2012-11-02: 4 mg via INTRAVENOUS
  Filled 2012-11-02: qty 2

## 2012-11-02 NOTE — ED Notes (Addendum)
Pt reports hx of pancreatitis. Last ETOH use 4 months ago. Reports RUQ abdominal pain, where pancreatitis pain where typical pain is. Pt reports typical s/s of pancreatitis flare up pt vomits a lot and then has relief. Pain started last night pt has not vomited. Denies dysuria. Has taken OTC pain meds without relief.   md at bedside Pt alert and oriented x4. Respirations even and unlabored, bilateral symmetrical rise and fall of chest. Skin warm and dry. In no acute distress. Denies needs.

## 2012-11-02 NOTE — ED Notes (Signed)
Pt escorted to discharge window. Pt verbalized understanding discharge instructions. In no acute distress.  

## 2012-11-02 NOTE — ED Notes (Signed)
US at bedside

## 2012-11-02 NOTE — ED Provider Notes (Addendum)
History     CSN: [id number]  Arrival date & time 11/02/12  4098   First MD Initiated Contact with Patient 11/02/12 [phone]      Chief Complaint  Patient presents with  . hx of pancreatitis, abdominal pain     (Consider location/radiation/quality/duration/timing/severity/associated sxs/prior treatment) HPI Patient presents with abdominal pain, nausea.  This episode began yesterday without clear precipitant.  Since onset the pain has been severe, focally about the upper abdomen, primarily on the right.  There is associated diaphoresis, generalized sense of discomfort, but no chest pain, no dyspnea.  He denies diarrhea or vomiting.  Notably, the patient identifies this pain as being similar to pancreatitis related pain he has experienced in the past. No relief with OTC medication. The patient last drank 4 months ago.  Past Medical History  Diagnosis Date  . Alcohol abuse     Father is strongly alcoholic with cirrhosis. Rehab stay in 06/2011  . Rotator cuff disorder   . Pancreatitis 06/2011  . Tobacco abuse     Past Surgical History  Procedure Laterality Date  . No past surgeries      Family History  Problem Relation Age of Onset  . Alcohol abuse    . Cirrhosis    . Healthy      History  Substance Use Topics  . Smoking status: Current Every Day Smoker -- 0.50 packs/day for 10 years    Types: Cigarettes  . Smokeless tobacco: Former Neurosurgeon  . Alcohol Use: No      Review of Systems  Constitutional:       Per HPI, otherwise negative  HENT:       Per HPI, otherwise negative  Respiratory:       Per HPI, otherwise negative  Cardiovascular:       Per HPI, otherwise negative  Gastrointestinal: Positive for nausea and abdominal pain. Negative for vomiting.  Endocrine:       Negative aside from HPI  Genitourinary:       Neg aside from HPI   Musculoskeletal:       Per HPI, otherwise negative  Skin: Negative.   Neurological: Negative for syncope.    Allergies  Review  of patient's allergies indicates no known allergies.  Home Medications   Current Outpatient Rx  Name  Route  Sig  Dispense  Refill  . celecoxib (CELEBREX) 200 MG capsule   Oral   Take 200 mg by mouth 2 (two) times daily.         [name] doxylamine, Sleep, (UNISOM) 25 MG tablet   Oral   Take 1 tablet (25 mg total) by mouth at bedtime as needed for sleep.   20 tablet   0   . ibuprofen (ADVIL,MOTRIN) 200 MG tablet   Oral   Take 800 mg by mouth every 6 (six) hours as needed for pain (for pain).           There were no vitals taken for this visit.  Physical Exam  Nursing note and vitals reviewed. Constitutional: He is oriented to person, place, and time. He appears well-developed. No distress.  HENT:  Head: Normocephalic and atraumatic.  Eyes: Conjunctivae and EOM are normal.  Cardiovascular: Normal rate and regular rhythm.   Pulmonary/Chest: Effort normal. No stridor. No respiratory distress.  Abdominal: He exhibits no distension. There is tenderness in the right upper quadrant and epigastric area. There is no rigidity, no rebound, no guarding, no tenderness at McBurney's point and negative Murphy's sign.  Musculoskeletal: He exhibits no edema.  Neurological: He is alert and oriented to person, place, and time.  Skin: Skin is warm. He is diaphoretic.  Psychiatric: He has a normal mood and affect.    ED Course  Procedures (including critical care time)  Labs Reviewed  CBC WITH DIFFERENTIAL  COMPREHENSIVE METABOLIC PANEL  LIPASE, BLOOD   No results found.   No diagnosis found.  O2- 99%ra, normal  Update: pain not substantially improved.  9:55 AM Labs notable for transaminase bump - elevated lipase. [place] ordered  11:10 AM Patient continues to have pain.  [place] c/w pancreatitis  MDM  This young male presents with epigastric pain, and I exam is uncomfortable appearing, diaphoretic, with tenderness to palpation about the epigastrium and right upper quadrant.  Ultrasound  is consistent with pancreatitis, which is also demonstrated on labs.  Given the patient's persistent pain he was admitted for further evaluation and management.       Gerhard Munch, MD 11/02/12 1111  11:47 AM In discussing the patient's results with him again, he requests discharge.  The patient states that he has obligations, including a job he has to attend. I reviewed her precautions, need for primary care followup with him, and he was discharged with analgesics.  Gerhard Munch, MD 11/02/12 1147

## 2012-11-06 ENCOUNTER — Encounter (HOSPITAL_BASED_OUTPATIENT_CLINIC_OR_DEPARTMENT_OTHER): Payer: Self-pay | Admitting: *Deleted

## 2012-11-06 ENCOUNTER — Emergency Department (HOSPITAL_BASED_OUTPATIENT_CLINIC_OR_DEPARTMENT_OTHER)
Admission: EM | Admit: 2012-11-06 | Discharge: 2012-11-06 | Disposition: A | Discharge status: Home / Self Care | Payer: Self-pay | Attending: Emergency Medicine | Admitting: Emergency Medicine

## 2012-11-06 DIAGNOSIS — K0889 Other specified disorders of teeth and supporting structures: Secondary | ICD-10-CM

## 2012-11-06 DIAGNOSIS — Z8719 Personal history of other diseases of the digestive system: Secondary | ICD-10-CM | POA: Insufficient documentation

## 2012-11-06 DIAGNOSIS — Z8739 Personal history of other diseases of the musculoskeletal system and connective tissue: Secondary | ICD-10-CM | POA: Insufficient documentation

## 2012-11-06 DIAGNOSIS — F17200 Nicotine dependence, unspecified, uncomplicated: Secondary | ICD-10-CM | POA: Insufficient documentation

## 2012-11-06 DIAGNOSIS — R51 Headache: Secondary | ICD-10-CM | POA: Insufficient documentation

## 2012-11-06 DIAGNOSIS — K089 Disorder of teeth and supporting structures, unspecified: Secondary | ICD-10-CM | POA: Insufficient documentation

## 2012-11-06 MED ORDER — HYDROCODONE-ACETAMINOPHEN 5-325 MG PO TABS: 2.0000 | ORAL_TABLET | Freq: Once | ORAL | Status: AC

## 2012-11-06 MED ORDER — HYDROCODONE-ACETAMINOPHEN 5-325 MG PO TABS
ORAL_TABLET | ORAL | Status: AC
  Administered 2012-11-06: 2 via ORAL
  Filled 2012-11-06: qty 2

## 2012-11-06 MED ORDER — HYDROCODONE-ACETAMINOPHEN 5-325 MG PO TABS: 2.0000 | ORAL_TABLET | ORAL | Status: DC | PRN

## 2012-11-06 MED ORDER — CLINDAMYCIN HCL 300 MG PO CAPS: 300.0000 mg | ORAL_CAPSULE | Freq: Four times a day (QID) | ORAL | Status: DC

## 2012-11-06 NOTE — ED Provider Notes (Signed)
History  This chart was scribed for non-physician practitioner, Elson Areas, PA-C working with Ethelda Chick, MD by Shari Heritage, ED Scribe. This patient was seen in room MH10/MH10 and the patient's care was started at 1452.   CSN: [id number]  Arrival date & time 11/06/12  1427   First MD Initiated Contact with Patient 11/06/12 1452      Chief Complaint  Patient presents with  . Dental Pain     Patient is a 27 y.o. male presenting with tooth pain. The history is provided by the patient. No language interpreter was used.  Dental PainThe primary symptoms include mouth pain. Primary symptoms do not include dental injury, oral bleeding, oral lesions, fever or sore throat. The symptoms began yesterday. The symptoms are unchanged. The symptoms are new. The symptoms occur constantly.  Mouth pain began 12 - 24 hours ago. Mouth pain occurs constantly. Mouth pain is unchanged. Affected locations include: teeth and gum(s).  Additional symptoms do not include: trismus, facial swelling, trouble swallowing, pain with swallowing and drooling. Medical issues include: alcohol problem.     HPI Comments: Randall Beck is a 27 y.o. male who presents to the Emergency Department complaining of moderate to severe, diffuse right-sided facial pain onset yesterday. He states that pain starts in his jaw and radiates up to his right ear. He says that he is afraid of the dentist and does not visit them regularly. Patient denies fever, chills, nausea, or vomiting. There is no trismus, facial swelling, drooling, difficulty swallowing, sore throat, or pain with swallowing. He has taken OTC medicines without relief. Patient is a current every day smoker. He has a medical history of alcohol abuse and pancreatitis.    Past Medical History  Diagnosis Date  . Alcohol abuse     Father is strongly alcoholic with cirrhosis. Rehab stay in 06/2011  . Rotator cuff disorder   . Pancreatitis 06/2011  . Tobacco abuse      Past Surgical History  Procedure Laterality Date  . No past surgeries      Family History  Problem Relation Age of Onset  . Alcohol abuse    . Cirrhosis    . Healthy      History  Substance Use Topics  . Smoking status: Current Every Day Smoker -- 0.50 packs/day for 10 years    Types: Cigarettes  . Smokeless tobacco: Former Neurosurgeon  . Alcohol Use: No      Review of Systems  Constitutional: Negative for fever.  HENT: Negative for sore throat, facial swelling, drooling and trouble swallowing.   All other systems reviewed and are negative.    Allergies  Review of patient's allergies indicates no known allergies.  Home Medications   Current Outpatient Rx  Name  Route  Sig  Dispense  Refill  . HYDROcodone-acetaminophen (NORCO/VICODIN) 5-325 MG per tablet   Oral   Take 1 tablet by mouth every 8 (eight) hours as needed for pain.   10 tablet   0   . ibuprofen (ADVIL,MOTRIN) 200 MG tablet   Oral   Take 800 mg by mouth every 6 (six) hours as needed for pain (for pain).           Triage Vitals: BP 130/70  Pulse 64  Temp(Src) 98.2 F (36.8 C) (Oral)  Resp 18  Ht 6' (1.829 m)  Wt 180 lb (81.647 kg)  BMI 24.41 kg/m2  SpO2 96%  Physical Exam  Constitutional: He is oriented to person, place, and  time. He appears well-developed and well-nourished. No distress.  HENT:  Head: Normocephalic and atraumatic. No trismus in the jaw.  Mouth/Throat: Abnormal dentition.  Impacted right lower wisdom tooth. No drooling.  Eyes: Conjunctivae and EOM are normal. Pupils are equal, round, and reactive to light.  Neck: Normal range of motion. Neck supple.  Cardiovascular: Normal rate, regular rhythm and normal heart sounds.   Pulmonary/Chest: Effort normal and breath sounds normal.  Musculoskeletal: Normal range of motion.  Neurological: He is alert and oriented to person, place, and time.  Skin: Skin is warm and dry. No rash noted. He is not diaphoretic.    ED Course   Procedures (including critical care time) DIAGNOSTIC STUDIES: Oxygen Saturation is 96% on room air, adequate by my interpretation.    COORDINATION OF CARE: 4:49 PM- Patient informed of current plan for treatment and evaluation and agrees with plan at this time.      Labs Reviewed - No data to display No results found.   1. Toothache       MDM  Pt advised to call dentist tomorrow to schedule appointment.   Pt given rx for clindamycin and hydrocodone       I personally performed the services in this documentation, which was scribed in my presence.  The recorded information has been reviewed and considered.   Barnet Pall.   Elson Areas, PA-C 11/06/12 1914  Lonia Skinner [place], [place] 11/06/12 1914

## 2012-11-06 NOTE — ED Notes (Signed)
Pt states he does not go to the dentist. C/O pain to right side of face. Appears to be cutting a tooth? Taking OTC meds without relief.

## 2012-11-09 NOTE — ED Provider Notes (Signed)
Medical screening examination/treatment/procedure(s) were performed by non-physician practitioner and as supervising physician I was immediately available for consultation/collaboration.  Jason Frisbee K Linker, MD 11/09/12 1737 

## 2012-11-12 IMAGING — CR DG LUMBAR SPINE COMPLETE 4+V
5 series · 5 of 5 positions shown · non-contrast
Comparison: 09/08/2011

CLINICAL DATA: Low back pain extending down both legs.  Fall.

LUMBAR SPINE - COMPLETE 4+ VIEW

[t lumbar spine ap]
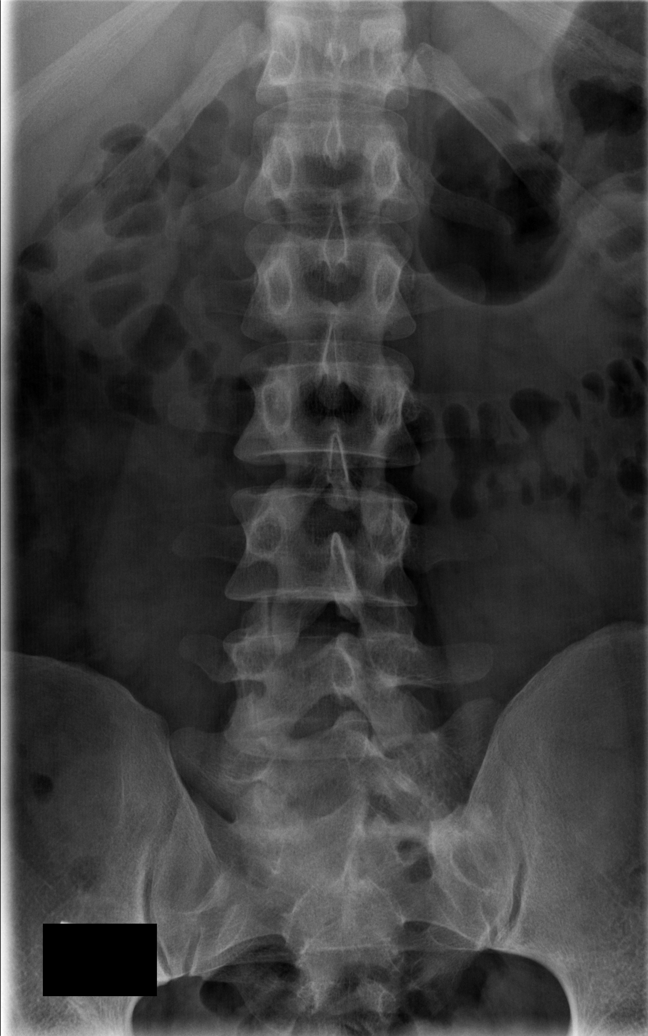

[t lumbar spine obl (1 of 2)]
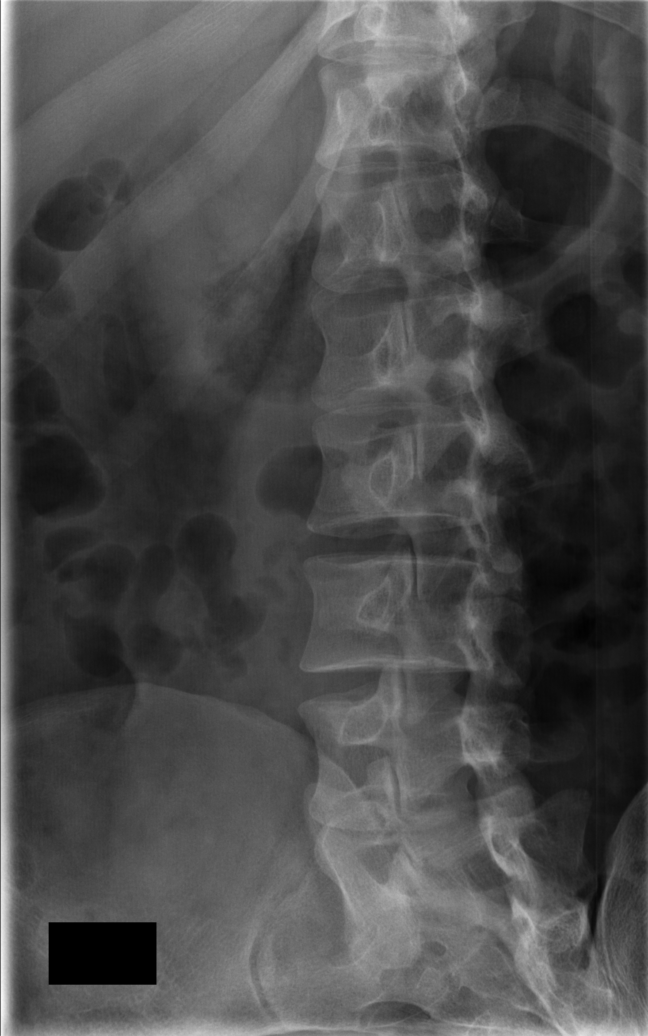

[t lumbar spine obl (2 of 2)]
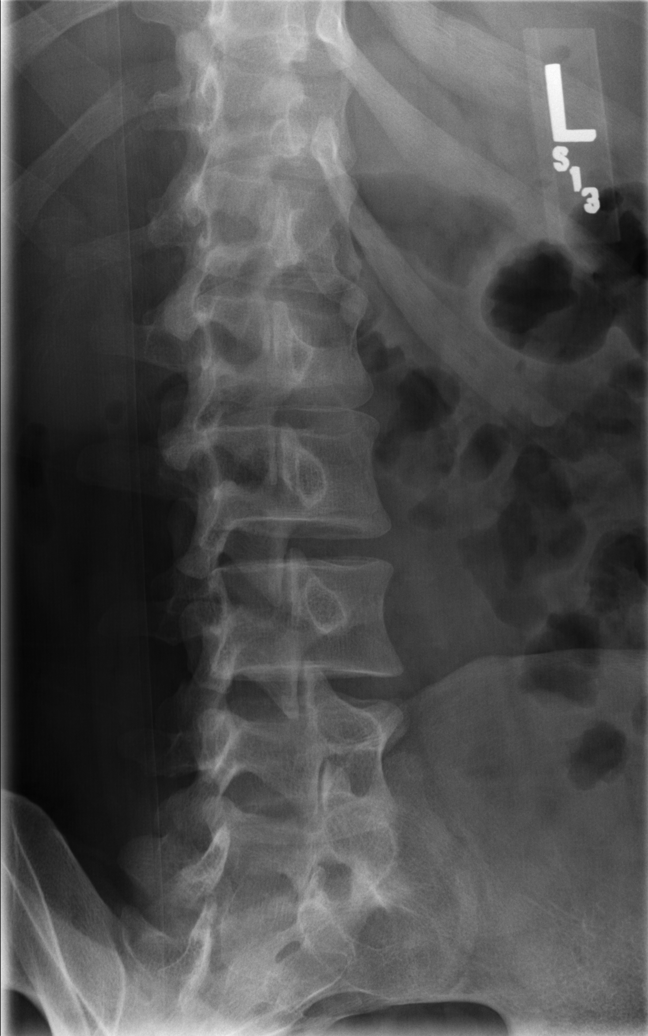

[t lumbar spine lat]
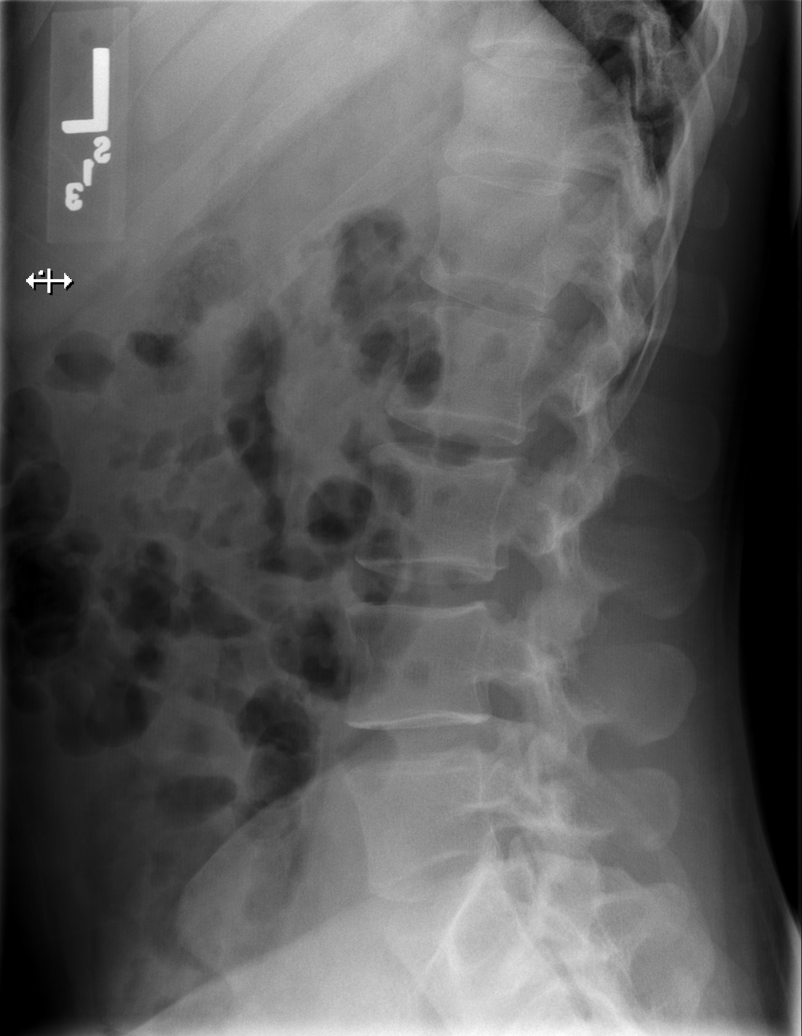

[t lumbar l-5 s-1 spot]
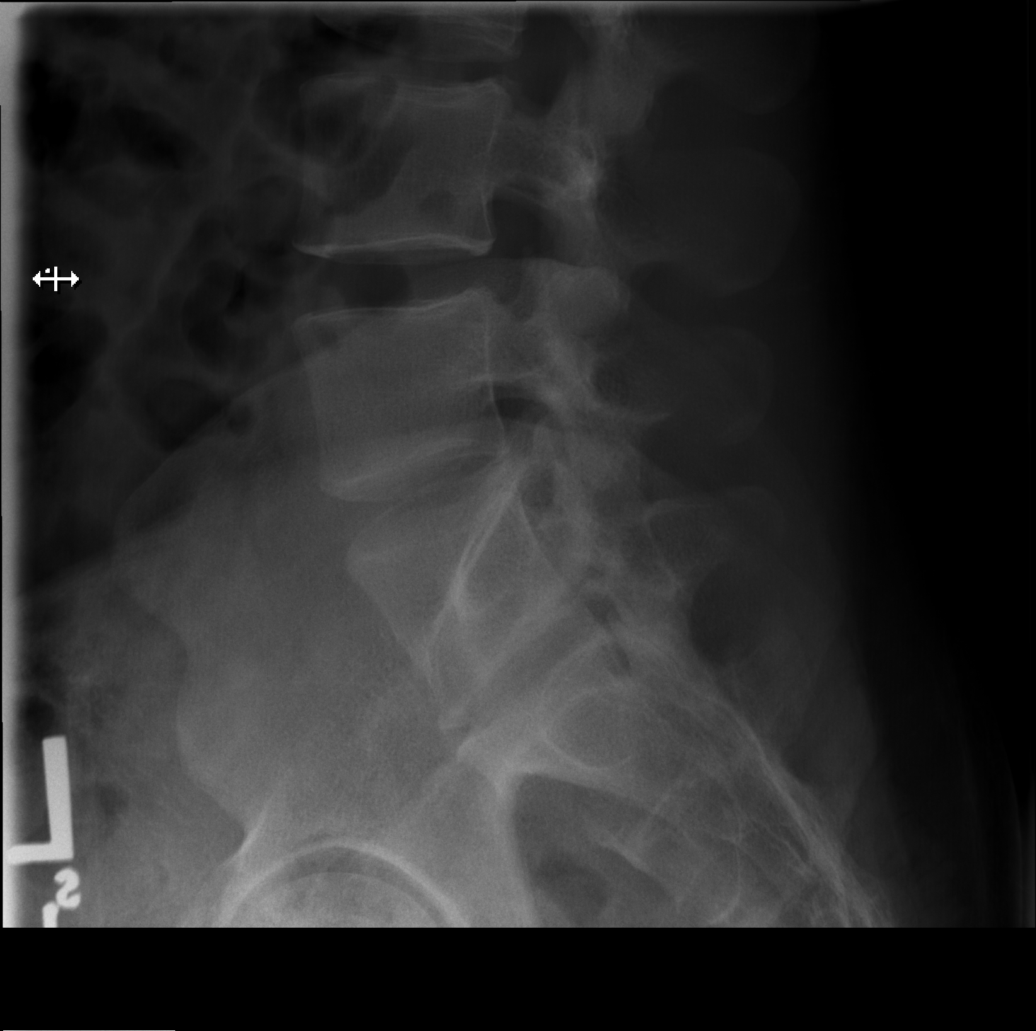

[5 of 5 positions shown; findings below may reference images not displayed]

FINDINGS: There are 12 large pairs of ribs with elongated
transverse processes at L1.  Transitional S1 vertebra noted.

No lumbar spine fracture, malalignment, or acute bony findings.  No
significant loss of disc height; rudimentary disc material noted at
S1-2.
IMPRESSION: 1.  Transitional S1 vertebra.  No acute findings.

## 2012-11-19 IMAGING — CR DG FOOT COMPLETE 3+V*R*
3 series · 3 of 3 positions shown · non-contrast
Comparison: None.

CLINICAL DATA: Fall.  Foot pain.

RIGHT FOOT COMPLETE - 3+ VIEW

[x foot ap right]
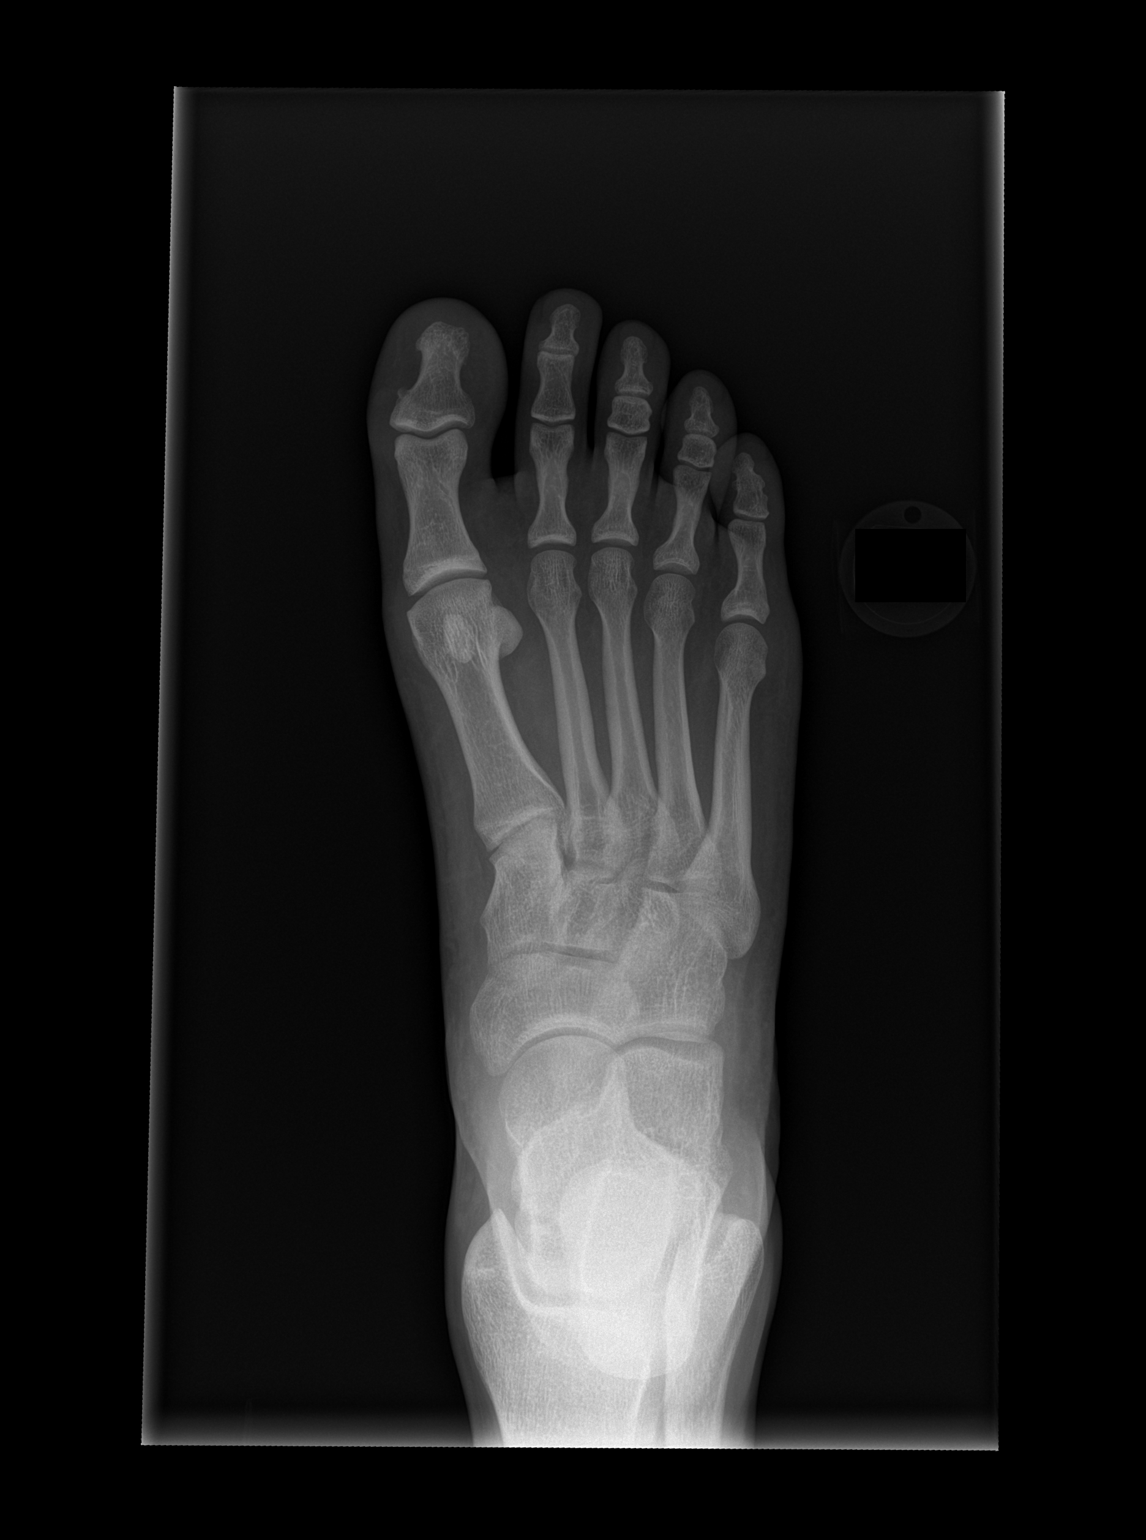

[x foot obl right]
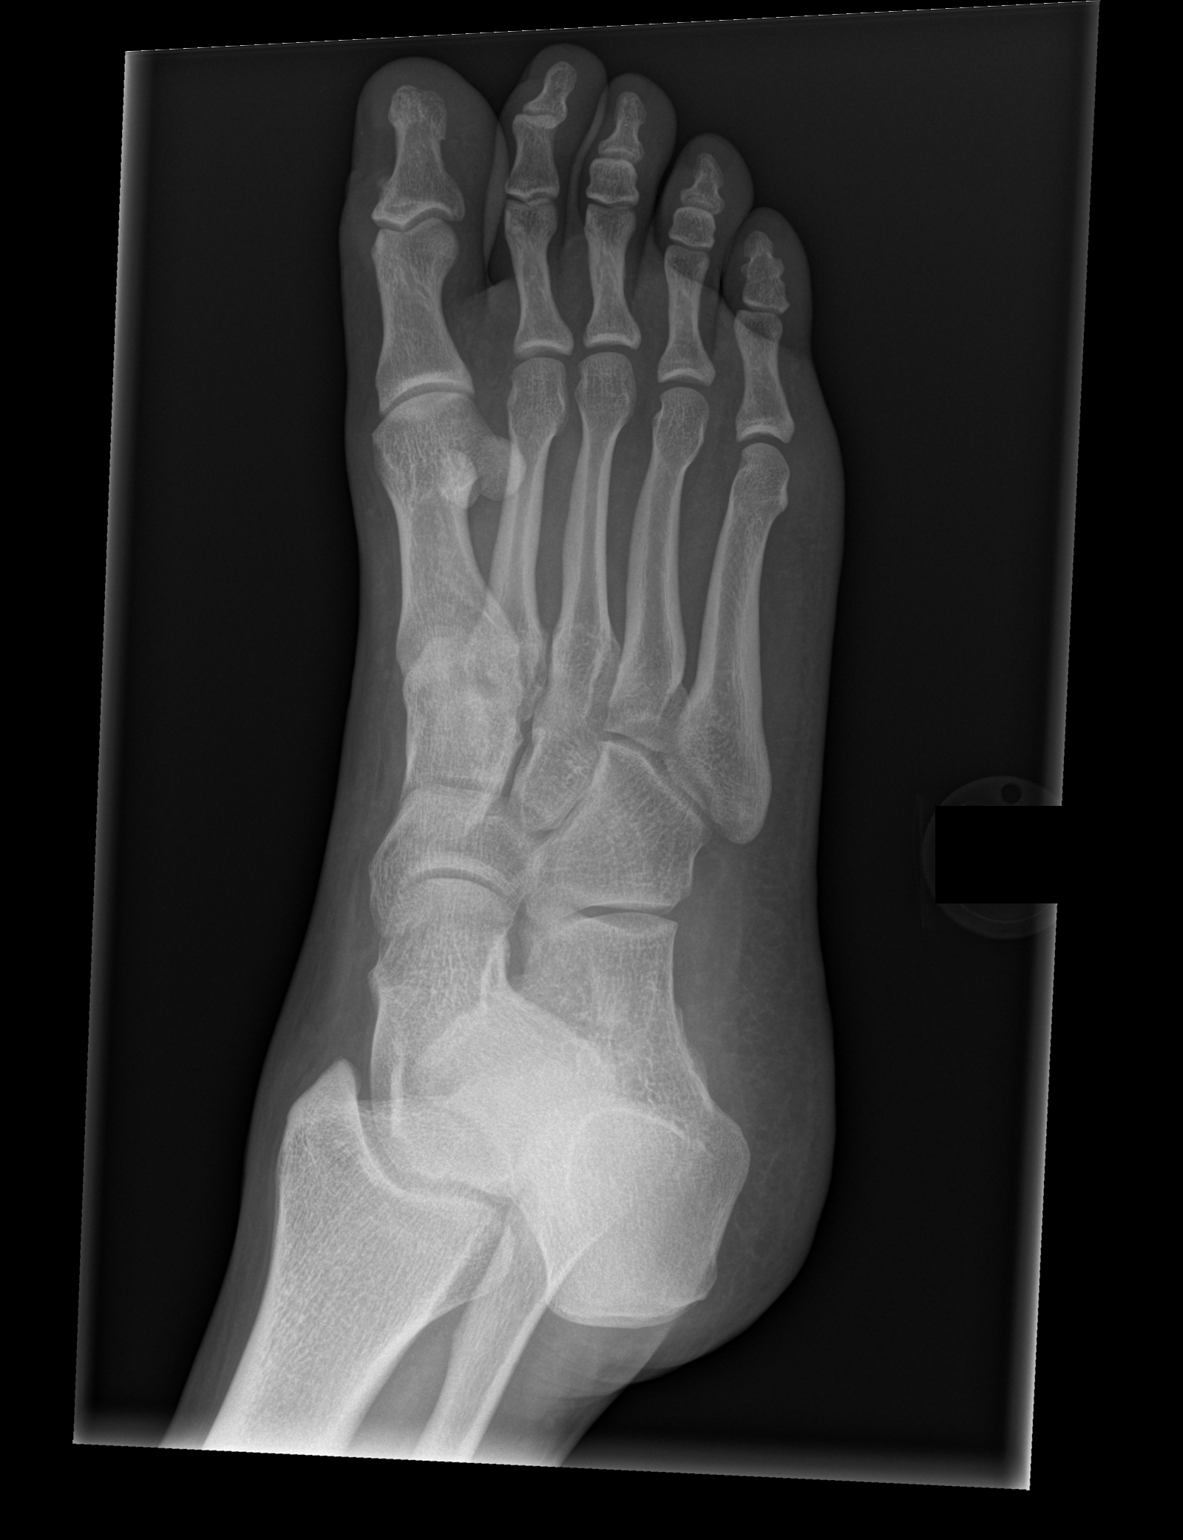

[x foot lat right]
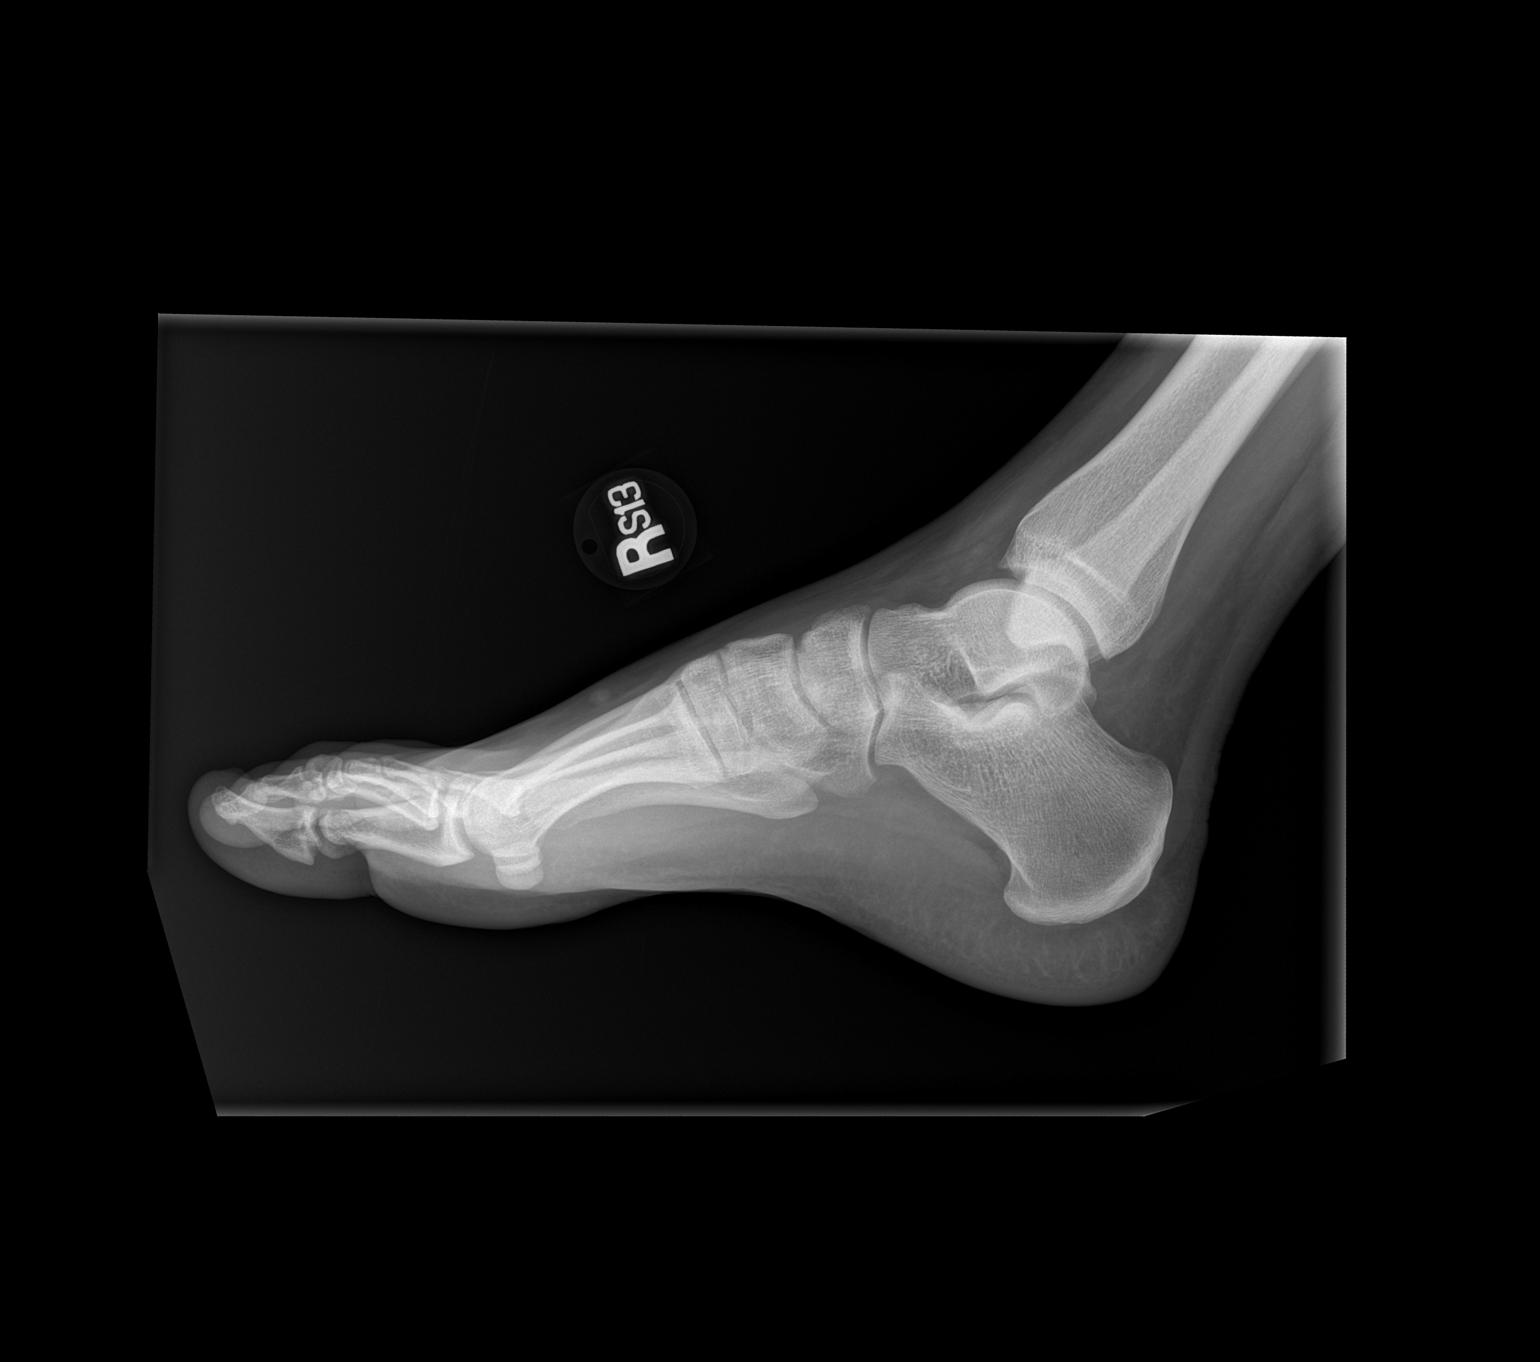

[3 of 3 positions shown; findings below may reference images not displayed]

FINDINGS: Anatomic alignment of the right foot.  No fracture.  Soft
tissues appear within normal limits.
IMPRESSION: No acute osseous injury.

## 2012-12-01 ENCOUNTER — Encounter (HOSPITAL_BASED_OUTPATIENT_CLINIC_OR_DEPARTMENT_OTHER): Payer: Self-pay | Admitting: Family Medicine

## 2012-12-01 ENCOUNTER — Emergency Department (HOSPITAL_BASED_OUTPATIENT_CLINIC_OR_DEPARTMENT_OTHER)
Admission: EM | Admit: 2012-12-01 | Discharge: 2012-12-01 | Disposition: A | Discharge status: Home / Self Care | Payer: Self-pay | Attending: Emergency Medicine | Admitting: Emergency Medicine

## 2012-12-01 DIAGNOSIS — M25519 Pain in unspecified shoulder: Secondary | ICD-10-CM | POA: Insufficient documentation

## 2012-12-01 DIAGNOSIS — G8929 Other chronic pain: Secondary | ICD-10-CM | POA: Insufficient documentation

## 2012-12-01 DIAGNOSIS — F17200 Nicotine dependence, unspecified, uncomplicated: Secondary | ICD-10-CM | POA: Insufficient documentation

## 2012-12-01 DIAGNOSIS — K011 Impacted teeth: Secondary | ICD-10-CM

## 2012-12-01 DIAGNOSIS — Z8739 Personal history of other diseases of the musculoskeletal system and connective tissue: Secondary | ICD-10-CM | POA: Insufficient documentation

## 2012-12-01 DIAGNOSIS — Z8719 Personal history of other diseases of the digestive system: Secondary | ICD-10-CM | POA: Insufficient documentation

## 2012-12-01 DIAGNOSIS — K006 Disturbances in tooth eruption: Secondary | ICD-10-CM | POA: Insufficient documentation

## 2012-12-01 DIAGNOSIS — K089 Disorder of teeth and supporting structures, unspecified: Secondary | ICD-10-CM | POA: Insufficient documentation

## 2012-12-01 MED ORDER — IBUPROFEN 800 MG PO TABS: 800.0000 mg | ORAL_TABLET | Freq: Three times a day (TID) | ORAL | Status: DC | PRN

## 2012-12-01 MED ORDER — GABAPENTIN 300 MG PO CAPS: 300.0000 mg | ORAL_CAPSULE | Freq: Three times a day (TID) | ORAL | Status: DC

## 2012-12-01 NOTE — ED Notes (Signed)
Pt c/o right lower dental pain that is an ongoing issue. Pt also c/o chronic right shoulder pain, denies new injury. Pt sts he is taking ibuprofen at home without relief. Pt sts he was on gabapentin in past with success and cannot afford to see orthopedist. Pt sts he does not like to take "narcotics".

## 2012-12-01 NOTE — ED Provider Notes (Signed)
History     CSN: [id number]  Arrival date & time 12/01/12  1022   First MD Initiated Contact with Patient 12/01/12 1051      Chief Complaint  Patient presents with  . Shoulder Pain  . Dental Pain    (Consider location/radiation/quality/duration/timing/severity/associated sxs/prior treatment) Patient is a 27 y.o. male presenting with shoulder pain and tooth pain.  Shoulder Pain  Dental Pain  Pt with chronic R shoulder pain and chronic dental pain from impacted R lower wisdom tooth presents to the ED for evaluation of both. He has been seen for same several times. 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 He states he has been seen by Ortho and given gabapentin in the past with good results but unable to afford followup. He also has known impacted wisdom tooth but does not go to dentists. He has been given Rx for narcotics several times in the past but states he does not want any more now.   Past Medical History  Diagnosis Date  . Alcohol abuse     Father is strongly alcoholic with cirrhosis. Rehab stay in 06/2011  . Rotator cuff disorder   . Pancreatitis 06/2011  . Tobacco abuse     Past Surgical History  Procedure Laterality Date  . No past surgeries      Family History  Problem Relation Age of Onset  . Alcohol abuse    . Cirrhosis    . Healthy      History  Substance Use Topics  . Smoking status: Current Every Day Smoker -- 0.50 packs/day for 10 years    Types: Cigarettes  . Smokeless tobacco: Former Neurosurgeon  . Alcohol Use: No      Review of Systems All other systems reviewed and are negative except as noted in HPI.   Allergies  Review of patient's allergies indicates no known allergies.  Home Medications   Current Outpatient Rx  Name  Route  Sig  Dispense  Refill  . clindamycin (CLEOCIN) 300 MG capsule   Oral   Take 1 capsule (300 mg total) by mouth every 6 (six) hours.   28 capsule   0   .  HYDROcodone-acetaminophen (NORCO/VICODIN) 5-325 MG per tablet   Oral   Take 1 tablet by mouth every 8 (eight) hours as needed for pain.   10 tablet   0   . HYDROcodone-acetaminophen (NORCO/VICODIN) 5-325 MG per tablet   Oral   Take 2 tablets by mouth every 4 (four) hours as needed for pain.   16 tablet   0   . ibuprofen (ADVIL,MOTRIN) 200 MG tablet   Oral   Take 800 mg by mouth every 6 (six) hours as needed for pain (for pain).           BP 141/91  Pulse 83  Temp(Src) 97.9 F (36.6 C) (Oral)  Resp 16  SpO2 100%  Physical Exam  Nursing note and vitals reviewed. Constitutional: He is oriented to person, place, and time. He appears well-developed and well-nourished.  HENT:  Head: Normocephalic and atraumatic.  Impacted R lower molar without signs of abscess or pericoronitis.   Eyes: EOM are normal. Pupils are equal, round, and reactive to light.  Neck: Normal range of motion. Neck supple.  Cardiovascular: Normal rate, normal heart sounds and intact distal pulses.   Pulmonary/Chest: Effort normal and breath sounds normal.  Abdominal: Bowel sounds are normal. He exhibits no distension. There is  no tenderness.  Musculoskeletal: He exhibits tenderness (tender R lateral shoulder, decreased ROM due to pain, no bony tenderness or deformity, neurovascularly intact). He exhibits no edema.  Neurological: He is alert and oriented to person, place, and time. He has normal strength. No cranial nerve deficit or sensory deficit.  Skin: Skin is warm and dry. No rash noted.  Psychiatric: He has a normal mood and affect.    ED Course  Procedures (including critical care time)  Labs Reviewed - No data to display No results found.   1. Chronic shoulder pain, right   2. Impacted molar       MDM  Pt with chronic shoulder and dental pain. No change in either from previous. Advised to continue Motrin at home. Neurontin Rx given. Referral to dentist.        Bonnita Levan. Bernette Mayers,  MD 12/01/12 [phone]

## 2013-03-01 ENCOUNTER — Emergency Department (HOSPITAL_COMMUNITY): Admission: EM | Admit: 2013-03-01 | Discharge: 2013-03-01 | Payer: Self-pay

## 2013-03-29 ENCOUNTER — Emergency Department (HOSPITAL_COMMUNITY)
Admission: EM | Admit: 2013-03-29 | Discharge: 2013-03-29 | Disposition: A | Discharge status: Home / Self Care | Payer: Self-pay | Attending: Emergency Medicine | Admitting: Emergency Medicine

## 2013-03-29 ENCOUNTER — Encounter (HOSPITAL_COMMUNITY): Payer: Self-pay

## 2013-03-29 DIAGNOSIS — K089 Disorder of teeth and supporting structures, unspecified: Secondary | ICD-10-CM | POA: Insufficient documentation

## 2013-03-29 DIAGNOSIS — F17200 Nicotine dependence, unspecified, uncomplicated: Secondary | ICD-10-CM | POA: Insufficient documentation

## 2013-03-29 DIAGNOSIS — Z792 Long term (current) use of antibiotics: Secondary | ICD-10-CM | POA: Insufficient documentation

## 2013-03-29 DIAGNOSIS — Z8739 Personal history of other diseases of the musculoskeletal system and connective tissue: Secondary | ICD-10-CM | POA: Insufficient documentation

## 2013-03-29 DIAGNOSIS — K0889 Other specified disorders of teeth and supporting structures: Secondary | ICD-10-CM

## 2013-03-29 DIAGNOSIS — F1021 Alcohol dependence, in remission: Secondary | ICD-10-CM | POA: Insufficient documentation

## 2013-03-29 MED ORDER — HYDROCODONE-ACETAMINOPHEN 5-325 MG PO TABS: 1.0000 | ORAL_TABLET | ORAL | Status: DC | PRN

## 2013-03-29 NOTE — ED Provider Notes (Signed)
CSN: [id number]     Arrival date & time 03/29/13  1421 History  This chart was scribed for Randall Beck, [place], working with Derwood Kaplan, MD by Blanchard Kelch, ED Scribe. This patient was seen in room WTR9/WTR9 and the patient's care was started at 3:08 PM.    Chief Complaint  Patient presents with  . Jaw Pain    The history is provided by the patient. No language interpreter was used.    HPI Comments: SAHID Beck is a 27 y.o. male who presents to the Emergency Department complaining of worsening intermittent left sided jaw pain that began two weeks ago. Patient states the pain goes from the bottom of the ear down to the left jaw. Patient had wisdom teeth on right side and a corroded tooth on left side removed in May. Patient reports taking doxycycline for the pain for five days as well as 4-5 tylenol twice a day and two ibuprofen a day. He denies relief from the medications. Patient reports mild relief with drinking ice water. He denies fever, chills, sore throat, facial swelling, trouble swallowing or trouble breathing.    Past Medical History  Diagnosis Date  . Alcohol abuse     Father is strongly alcoholic with cirrhosis. Rehab stay in 06/2011  . Rotator cuff disorder   . Pancreatitis 06/2011  . Tobacco abuse    Past Surgical History  Procedure Laterality Date  . No past surgeries    . Wisdom tooth extraction Right    Family History  Problem Relation Age of Onset  . Alcohol abuse    . Cirrhosis    . Healthy     History  Substance Use Topics  . Smoking status: Current Every Day Smoker -- 0.50 packs/day for 10 years    Types: Cigarettes  . Smokeless tobacco: Former Neurosurgeon  . Alcohol Use: No    Review of Systems  Constitutional: Negative for fever.  HENT: Positive for dental problem. Negative for facial swelling and trouble swallowing.   Respiratory: Negative for shortness of breath.     Allergies  Review of patient's allergies indicates no known allergies.  Home  Medications   Current Outpatient Rx  Name  Route  Sig  Dispense  Refill  . acetaminophen (TYLENOL) 500 MG tablet   Oral   Take 2,000 mg by mouth every 6 (six) hours as needed for pain.         [name] doxycycline (VIBRAMYCIN) 100 MG capsule   Oral   Take 100 mg by mouth 2 (two) times daily.         [name] ibuprofen (ADVIL,MOTRIN) 200 MG tablet   Oral   Take 800 mg by mouth every 6 (six) hours as needed for pain.          Triage Vitals: BP 123/80  Pulse 93  Temp(Src) 99.1 F (37.3 C) (Oral)  Resp 20  SpO2 98%  Physical Exam  Nursing note and vitals reviewed. Constitutional: He appears well-developed and well-nourished. No distress.  HENT:  Head: Normocephalic and atraumatic.  Mouth/Throat: Uvula is midline and oropharynx is clear and moist. Mucous membranes are not dry. No edematous. No oropharyngeal exudate, posterior oropharyngeal edema or posterior oropharyngeal erythema.    Left lower third molar is mostly grown in with surrounding tenderness in all directions but without notable swelling or erythema. No discharge  Neck: Neck supple.  Pulmonary/Chest: Effort normal.  Neurological: He is alert.  Skin: He is not diaphoretic.    ED Course  Procedures (including critical care time)  DIAGNOSTIC STUDIES: Oxygen Saturation is 98% on room air, normal by my interpretation.    COORDINATION OF CARE:  3:13 PM - Patient verbalizes understanding and agrees with treatment plan.    Labs Review Labs Reviewed - No data to display Imaging Review No results found.  MDM   1. Pain, dental    Patient with left lower dental pain from incoming wisdom tooth. Patient has tenderness all around tooth but no evidence of infection or abscess. Patient is taking doxycycline previously provided for him by his dentist. Needs a referral for dentists and pain control. Discharged home with both. No airway concerns.Discussed findings, treatment, and follow up  with patient.  Pt given return  precautions.  Pt verbalizes understanding and agrees with plan.      I personally performed the services described in this documentation, which was scribed in my presence. The recorded information has been reviewed and is accurate.   I doubt any other EMC precluding discharge at this time including, but not necessarily limited to the following: Ludwig angina, deep space head or neck infection  Randall Dredge, PA-C 03/29/13 1613

## 2013-03-29 NOTE — Progress Notes (Signed)
[facility] provided pt with a list of primary care resources in [place].  °

## 2013-03-29 NOTE — ED Notes (Signed)
Pt c/o intermittent L side jaw pain x 2 weeks.  Pain score 7/10.  Pt sts he previously had similar pain on R side and had to have wisdom teeth removed.

## 2013-03-30 NOTE — ED Provider Notes (Signed)
Medical screening examination/treatment/procedure(s) were performed by non-physician practitioner and as supervising physician I was immediately available for consultation/collaboration.  Derwood Kaplan, MD 03/30/13 [phone]

## 2013-07-10 ENCOUNTER — Encounter (HOSPITAL_COMMUNITY): Payer: Self-pay | Admitting: Emergency Medicine

## 2013-07-10 ENCOUNTER — Emergency Department (HOSPITAL_COMMUNITY)
Admission: EM | Admit: 2013-07-10 | Discharge: 2013-07-10 | Disposition: A | Discharge status: Home / Self Care | Payer: Self-pay | Attending: Emergency Medicine | Admitting: Emergency Medicine

## 2013-07-10 DIAGNOSIS — K2920 Alcoholic gastritis without bleeding: Secondary | ICD-10-CM | POA: Insufficient documentation

## 2013-07-10 DIAGNOSIS — Z8739 Personal history of other diseases of the musculoskeletal system and connective tissue: Secondary | ICD-10-CM | POA: Insufficient documentation

## 2013-07-10 DIAGNOSIS — F17200 Nicotine dependence, unspecified, uncomplicated: Secondary | ICD-10-CM | POA: Insufficient documentation

## 2013-07-10 DIAGNOSIS — Z79899 Other long term (current) drug therapy: Secondary | ICD-10-CM | POA: Insufficient documentation

## 2013-07-10 DIAGNOSIS — R109 Unspecified abdominal pain: Secondary | ICD-10-CM

## 2013-07-10 LAB — CBC WITH DIFFERENTIAL/PLATELET
Basophils Absolute: 0 10*3/uL (ref 0.0–0.1)
Basophils Relative: 0 % (ref 0–1)
MCHC: 35.5 g/dL (ref 30.0–36.0)
Monocytes Absolute: 1 10*3/uL (ref 0.1–1.0)
Neutro Abs: 4.3 10*3/uL (ref 1.7–7.7)
Neutrophils Relative %: 54 % (ref 43–77)
Platelets: 278 10*3/uL (ref 150–400)
RDW: 13.4 % (ref 11.5–15.5)

## 2013-07-10 LAB — COMPREHENSIVE METABOLIC PANEL
AST: 18 U/L (ref 0–37)
Albumin: 4.2 g/dL (ref 3.5–5.2)
Chloride: 98 mEq/L (ref 96–112)
Creatinine, Ser: 0.98 mg/dL (ref 0.50–1.35)
Potassium: 4.2 mEq/L (ref 3.5–5.1)
Sodium: 135 mEq/L (ref 135–145)
Total Bilirubin: 0.6 mg/dL (ref 0.3–1.2)

## 2013-07-10 LAB — URINALYSIS, ROUTINE W REFLEX MICROSCOPIC
Glucose, UA: NEGATIVE mg/dL
Ketones, ur: NEGATIVE mg/dL
Leukocytes, UA: NEGATIVE
pH: 6.5 (ref 5.0–8.0)

## 2013-07-10 MED ORDER — FAMOTIDINE 20 MG PO TABS: 20.0000 mg | ORAL_TABLET | Freq: Two times a day (BID) | ORAL | Status: DC

## 2013-07-10 MED ORDER — HYDROMORPHONE HCL PF 1 MG/ML IJ SOLN
1.0000 mg | Freq: Once | INTRAMUSCULAR | Status: AC
  Administered 2013-07-10: 1 mg via INTRAVENOUS
  Filled 2013-07-10: qty 1

## 2013-07-10 MED ORDER — PANTOPRAZOLE SODIUM 40 MG IV SOLR
40.0000 mg | Freq: Once | INTRAVENOUS | Status: AC
  Administered 2013-07-10: 40 mg via INTRAVENOUS
  Filled 2013-07-10: qty 40

## 2013-07-10 MED ORDER — ONDANSETRON HCL 4 MG/2ML IJ SOLN
4.0000 mg | Freq: Once | INTRAMUSCULAR | Status: AC
  Administered 2013-07-10: 4 mg via INTRAVENOUS
  Filled 2013-07-10: qty 2

## 2013-07-10 MED ORDER — ONDANSETRON 8 MG PO TBDP
8.0000 mg | ORAL_TABLET | Freq: Once | ORAL | Status: AC
  Administered 2013-07-10: 8 mg via ORAL
  Filled 2013-07-10: qty 1

## 2013-07-10 MED ORDER — MORPHINE SULFATE 4 MG/ML IJ SOLN
4.0000 mg | Freq: Once | INTRAMUSCULAR | Status: AC
  Administered 2013-07-10: 4 mg via INTRAVENOUS
  Filled 2013-07-10: qty 1

## 2013-07-10 MED ORDER — SODIUM CHLORIDE 0.9 % IV BOLUS (SEPSIS)
1000.0000 mL | Freq: Once | INTRAVENOUS | Status: AC
  Administered 2013-07-10: 1000 mL via INTRAVENOUS

## 2013-07-10 MED ORDER — ONDANSETRON HCL 4 MG PO TABS: 4.0000 mg | ORAL_TABLET | Freq: Four times a day (QID) | ORAL | Status: DC

## 2013-07-10 NOTE — ED Provider Notes (Signed)
CSN: [id number]     Arrival date & time 07/10/13  1913 History   First MD Initiated Contact with Patient 07/10/13 2009     Chief Complaint  Patient presents with  . Abdominal Pain   (Consider location/radiation/quality/duration/timing/severity/associated sxs/prior Treatment) HPI Comments: Patient is a 27 year old male with a past medical history of pancreatitis and alcohol abuse who presents to emergency department complaining of sudden onset midepigastric abdominal pain beginning around 3 clock this morning while he was sleeping. Patient states he went to a party on Friday night, had a he drinks first time in about a year, he was not experiencing any pain and decided to drink more. Admits to associated nausea with about 10 episodes of vomiting. Denies fever, chills, urinary or bowel changes. He tried taking a Xanax and Vicodin without relief as he was unable to keep these down. Admitted July 2013 for pancreatitis flareup.  Patient is a 27 y.o. male presenting with abdominal pain. The history is provided by the patient.  Abdominal Pain Associated symptoms: nausea and vomiting     Past Medical History  Diagnosis Date  . Alcohol abuse     Father is strongly alcoholic with cirrhosis. Rehab stay in 06/2011  . Rotator cuff disorder   . Pancreatitis 06/2011  . Tobacco abuse    Past Surgical History  Procedure Laterality Date  . No past surgeries    . Wisdom tooth extraction Right    Family History  Problem Relation Age of Onset  . Alcohol abuse    . Cirrhosis    . Healthy     History  Substance Use Topics  . Smoking status: Current Every Day Smoker -- 0.50 packs/day for 10 years    Types: Cigarettes  . Smokeless tobacco: Former Neurosurgeon  . Alcohol Use: 0.0 oz/week    Review of Systems  Gastrointestinal: Positive for nausea, vomiting and abdominal pain.  All other systems reviewed and are negative.    Allergies  Review of patient's allergies indicates no known  allergies.  Home Medications   Current Outpatient Rx  Name  Route  Sig  Dispense  Refill  . ALPRAZolam (XANAX) 0.5 MG tablet   Oral   Take 0.5 mg by mouth once.         . Hydrocodone-Acetaminophen (VICODIN PO)   Oral   Take 1 tablet by mouth once.         . famotidine (PEPCID) 20 MG tablet   Oral   Take 1 tablet (20 mg total) by mouth 2 (two) times daily.   30 tablet   0   . ondansetron (ZOFRAN) 4 MG tablet   Oral   Take 1 tablet (4 mg total) by mouth every 6 (six) hours.   12 tablet   0    BP 162/101  Pulse 81  Temp(Src) 99.1 F (37.3 C) (Oral)  Resp 22  SpO2 99% Physical Exam  Nursing note and vitals reviewed. Constitutional: He is oriented to person, place, and time. He appears well-developed and well-nourished. He appears distressed.  Tearful.  HENT:  Head: Normocephalic and atraumatic.  Mouth/Throat: Oropharynx is clear and moist.  Eyes: Conjunctivae are normal. No scleral icterus.  Neck: Normal range of motion. Neck supple.  Cardiovascular: Normal rate, regular rhythm and normal heart sounds.   Pulmonary/Chest: Effort normal and breath sounds normal.  Abdominal: Soft. Normal appearance and bowel sounds are normal. He exhibits no distension. There is tenderness in the epigastric area. There is guarding. There is no  rigidity and no rebound.  No peritoneal signs.  Musculoskeletal: Normal range of motion. He exhibits no edema.  Neurological: He is alert and oriented to person, place, and time.  Skin: Skin is warm and dry. He is not diaphoretic.  Psychiatric: He has a normal mood and affect. His behavior is normal.    ED Course  Procedures (including critical care time) Labs Review Labs Reviewed  COMPREHENSIVE METABOLIC PANEL - Abnormal; Notable for the following:    Glucose, Bld 100 (*)    All other components within normal limits  CBC WITH DIFFERENTIAL  LIPASE, BLOOD  URINALYSIS, ROUTINE W REFLEX MICROSCOPIC   Imaging Review No results  found.  EKG Interpretation   None       MDM   1. Alcoholic gastritis   2. Abdominal pain     Pt presenting with with mid-epigastric abdominal pain, n/v. Hx of pancreatitis, drank alcohol for the first time in about 1 year. Probable pancreatitis flare vs alcoholic gastritis. Labs pending. Pain/nausea control with morphine, zofran. Protonix given. 10:29 PM Pain controlled with above medication regimen in addition to 1 dose of dilaudid. Labs normal. He is tolerating PO. Stable for discharge, strongly advised against alcohol intake. Discharged with Pepcid and Zofran. Return precautions given. Patient states understanding of treatment care plan and is agreeable.   Trevor Mace, PA-C 07/10/13 2230

## 2013-07-10 NOTE — ED Notes (Signed)
Pt reports that he has a hx of pancreatitis, has not had alcohol in a while, but states he went to a party Friday and drank and "it didn't bother me" so last night he drank "a lot more" watching football. Pt states he has n/v x10 in the past 24 hours, denies diarrhea.

## 2013-07-10 NOTE — ED Notes (Signed)
Pt tolerating fluids and solids.

## 2013-07-10 NOTE — ED Provider Notes (Signed)
Medical screening examination/treatment/procedure(s) were performed by non-physician practitioner and as supervising physician I was immediately available for consultation/collaboration.  EKG Interpretation   None         Dagmar Hait, MD 07/10/13 2259

## 2013-07-28 ENCOUNTER — Encounter (HOSPITAL_COMMUNITY): Payer: Self-pay | Admitting: Emergency Medicine

## 2013-07-28 ENCOUNTER — Emergency Department (HOSPITAL_COMMUNITY)
Admission: EM | Admit: 2013-07-28 | Discharge: 2013-07-28 | Disposition: A | Discharge status: Home / Self Care | Payer: Self-pay | Attending: Emergency Medicine | Admitting: Emergency Medicine

## 2013-07-28 ENCOUNTER — Inpatient Hospital Stay (HOSPITAL_COMMUNITY): Admission: AD | Admit: 2013-07-28 | Payer: Self-pay | Source: Ambulatory Visit | Admitting: Psychiatry

## 2013-07-28 DIAGNOSIS — F17200 Nicotine dependence, unspecified, uncomplicated: Secondary | ICD-10-CM | POA: Insufficient documentation

## 2013-07-28 DIAGNOSIS — Z8719 Personal history of other diseases of the digestive system: Secondary | ICD-10-CM | POA: Insufficient documentation

## 2013-07-28 DIAGNOSIS — F411 Generalized anxiety disorder: Secondary | ICD-10-CM | POA: Insufficient documentation

## 2013-07-28 DIAGNOSIS — Z8739 Personal history of other diseases of the musculoskeletal system and connective tissue: Secondary | ICD-10-CM | POA: Insufficient documentation

## 2013-07-28 DIAGNOSIS — Z79899 Other long term (current) drug therapy: Secondary | ICD-10-CM | POA: Insufficient documentation

## 2013-07-28 DIAGNOSIS — F1010 Alcohol abuse, uncomplicated: Secondary | ICD-10-CM | POA: Insufficient documentation

## 2013-07-28 LAB — COMPREHENSIVE METABOLIC PANEL
ALBUMIN: 4.7 g/dL (ref 3.5–5.2)
ALT: 20 U/L (ref 0–53)
AST: 36 U/L (ref 0–37)
Alkaline Phosphatase: 63 U/L (ref 39–117)
BILIRUBIN TOTAL: 0.9 mg/dL (ref 0.3–1.2)
BUN: 9 mg/dL (ref 6–23)
CALCIUM: 9.6 mg/dL (ref 8.4–10.5)
CO2: 23 mEq/L (ref 19–32)
CREATININE: 0.82 mg/dL (ref 0.50–1.35)
Chloride: 94 mEq/L — ABNORMAL LOW (ref 96–112)
GFR calc Af Amer: >90 mL/min (ref >90)
GFR calc non Af Amer: >90 mL/min (ref >90)
Glucose, Bld: 89 mg/dL (ref 70–99)
Potassium: 3.9 mEq/L (ref 3.7–5.3)
Sodium: 137 mEq/L (ref 137–147)
TOTAL PROTEIN: 8.4 g/dL — AB (ref 6.0–8.3)

## 2013-07-28 LAB — SALICYLATE LEVEL

## 2013-07-28 LAB — CBC
HEMATOCRIT: 48.4 % (ref 39.0–52.0)
Hemoglobin: 16.9 g/dL (ref 13.0–17.0)
MCH: 32.5 pg (ref 26.0–34.0)
MCHC: 34.9 g/dL (ref 30.0–36.0)
MCV: 93.1 fL (ref 78.0–100.0)
PLATELETS: 178 10*3/uL (ref 150–400)
RBC: 5.2 MIL/uL (ref 4.22–5.81)
RDW: 13.2 % (ref 11.5–15.5)
WBC: 6.9 10*3/uL (ref 4.0–10.5)

## 2013-07-28 LAB — RAPID URINE DRUG SCREEN, HOSP PERFORMED
AMPHETAMINES: NOT DETECTED
BENZODIAZEPINES: NOT DETECTED
Barbiturates: NOT DETECTED
COCAINE: NOT DETECTED
OPIATES: NOT DETECTED
Tetrahydrocannabinol: NOT DETECTED

## 2013-07-28 LAB — ACETAMINOPHEN LEVEL: Acetaminophen (Tylenol), Serum: <15 ug/mL (ref 10–30)

## 2013-07-28 LAB — ETHANOL: ALCOHOL ETHYL (B): 221 mg/dL — AB (ref 0–11)

## 2013-07-28 MED ORDER — THIAMINE HCL 100 MG/ML IJ SOLN: 100.0000 mg | Freq: Every day | INTRAMUSCULAR | Status: DC

## 2013-07-28 MED ORDER — NICOTINE 21 MG/24HR TD PT24
21.0000 mg | MEDICATED_PATCH | Freq: Every day | TRANSDERMAL | Status: DC
  Administered 2013-07-28: 21 mg via TRANSDERMAL
  Filled 2013-07-28: qty 1

## 2013-07-28 MED ORDER — FOLIC ACID 1 MG PO TABS
1.0000 mg | ORAL_TABLET | Freq: Every day | ORAL | Status: DC
  Administered 2013-07-28: 1 mg via ORAL
  Filled 2013-07-28: qty 1

## 2013-07-28 MED ORDER — LORAZEPAM 1 MG PO TABS: 0.0000 mg | ORAL_TABLET | Freq: Two times a day (BID) | ORAL | Status: DC

## 2013-07-28 MED ORDER — IBUPROFEN 200 MG PO TABS: 600.0000 mg | ORAL_TABLET | Freq: Three times a day (TID) | ORAL | Status: DC | PRN

## 2013-07-28 MED ORDER — ADULT MULTIVITAMIN W/MINERALS CH
1.0000 | ORAL_TABLET | Freq: Every day | ORAL | Status: DC
  Administered 2013-07-28: 1 via ORAL
  Filled 2013-07-28: qty 1

## 2013-07-28 MED ORDER — ONDANSETRON HCL 4 MG PO TABS: 4.0000 mg | ORAL_TABLET | Freq: Three times a day (TID) | ORAL | Status: DC | PRN

## 2013-07-28 MED ORDER — LORAZEPAM 2 MG/ML IJ SOLN: 1.0000 mg | Freq: Four times a day (QID) | INTRAMUSCULAR | Status: DC | PRN

## 2013-07-28 MED ORDER — ACETAMINOPHEN 325 MG PO TABS
650.0000 mg | ORAL_TABLET | ORAL | Status: DC | PRN
  Administered 2013-07-28: 650 mg via ORAL
  Filled 2013-07-28: qty 2

## 2013-07-28 MED ORDER — LORAZEPAM 1 MG PO TABS
1.0000 mg | ORAL_TABLET | Freq: Four times a day (QID) | ORAL | Status: DC | PRN
  Administered 2013-07-28: 1 mg via ORAL
  Filled 2013-07-28: qty 1

## 2013-07-28 MED ORDER — ALUM & MAG HYDROXIDE-SIMETH 200-200-20 MG/5ML PO SUSP: 30.0000 mL | ORAL | Status: DC | PRN

## 2013-07-28 MED ORDER — VITAMIN B-1 100 MG PO TABS
100.0000 mg | ORAL_TABLET | Freq: Every day | ORAL | Status: DC
  Administered 2013-07-28: 100 mg via ORAL
  Filled 2013-07-28: qty 1

## 2013-07-28 MED ORDER — LORAZEPAM 1 MG PO TABS
0.0000 mg | ORAL_TABLET | Freq: Four times a day (QID) | ORAL | Status: DC
  Administered 2013-07-28 (×3): 2 mg via ORAL
  Filled 2013-07-28 (×2): qty 2

## 2013-07-28 MED ORDER — PREGABALIN 50 MG PO CAPS
300.0000 mg | ORAL_CAPSULE | Freq: Two times a day (BID) | ORAL | Status: DC
  Administered 2013-07-28: 300 mg via ORAL
  Filled 2013-07-28: qty 6

## 2013-07-28 NOTE — ED Notes (Signed)
[facility] called to report, searching for [facility] or [facility] placement for pt.

## 2013-07-28 NOTE — Progress Notes (Signed)
Writer informed the ER MD (Dr. Lynelle DoctorKnapp) and the nurse that the patient has been accepted to [facility].  Writer arranged transportation through [employer].

## 2013-07-28 NOTE — ED Notes (Signed)
Pt in restroom getting changed in to scrubs and trying to obtain urine specimen.

## 2013-07-28 NOTE — ED Notes (Signed)
Belongings placed in locker 34

## 2013-07-28 NOTE — ED Provider Notes (Signed)
CSN: [id number]     Arrival date & time 07/28/13  1554 History  This chart was scribed for non-physician practitioner, Jovita Kussmaul, working with Rolland Porter, MD by Smiley Houseman, ED Scribe. This patient was seen in room WTR3/WLPT3 and the patient's care was started at 5:45 PM.    Chief Complaint  Patient presents with  . Medical Clearance  . detox    The history is provided by the patient. No language interpreter was used.   HPI Comments: Randall Beck is a 28 y.o. male who presents to the Emergency Department needing medical clearance due to alcohol abuse.  He states his last drink was around 2:00PM and it was about half of a 40oz beer.  Pt state he usually goes about 2 to 3 months without a drink, but will relapse due to a personal trigger.  Pt states he has been here before for this and feels embarrassed to be here.  He reports he has been drinking since the age of 40.  Pt states his father abuses alcohol and his grandfather and uncle have died due to alcohol abuse.  Pt denies any suicidal thoughts, homicidal thoughts, or hallucinations.    Past Medical History  Diagnosis Date  . Alcohol abuse     Father is strongly alcoholic with cirrhosis. Rehab stay in 06/2011  . Rotator cuff disorder   . Pancreatitis 06/2011  . Tobacco abuse    Past Surgical History  Procedure Laterality Date  . No past surgeries    . Wisdom tooth extraction Right    Family History  Problem Relation Age of Onset  . Alcohol abuse    . Cirrhosis    . Healthy     History  Substance Use Topics  . Smoking status: Current Every Day Smoker -- 0.50 packs/day for 10 years    Types: Cigarettes  . Smokeless tobacco: Former Neurosurgeon  . Alcohol Use: 0.0 oz/week    Review of Systems  Constitutional: Negative for fever and chills.  HENT: Negative for congestion and rhinorrhea.   Respiratory: Negative for cough and shortness of breath.   Cardiovascular: Negative for chest pain.  Gastrointestinal: Negative for  nausea, vomiting, abdominal pain and diarrhea.  Musculoskeletal: Negative for back pain.  Skin: Negative for color change and rash.  Neurological: Negative for syncope.  Psychiatric/Behavioral: Negative for suicidal ideas and hallucinations. The patient is nervous/anxious.   All other systems reviewed and are negative.    Allergies  Review of patient's allergies indicates no known allergies.  Home Medications   Current Outpatient Rx  Name  Route  Sig  Dispense  Refill  . ALPRAZolam (XANAX) 0.5 MG tablet   Oral   Take 0.5 mg by mouth once.         . famotidine (PEPCID) 20 MG tablet   Oral   Take 1 tablet (20 mg total) by mouth 2 (two) times daily.   30 tablet   0   . Hydrocodone-Acetaminophen (VICODIN PO)   Oral   Take 1 tablet by mouth once.         . ondansetron (ZOFRAN) 4 MG tablet   Oral   Take 1 tablet (4 mg total) by mouth every 6 (six) hours.   12 tablet   0    Triage Vitals: BP 159/104  Pulse 107  Temp(Src) 98.6 F (37 C) (Oral)  Resp 19  SpO2 97% Physical Exam  Nursing note and vitals reviewed. Constitutional: He is oriented to person, place, and  time. He appears well-developed and well-nourished. No distress.  HENT:  Head: Normocephalic and atraumatic.  Eyes: EOM are normal.  Neck: Neck supple. No tracheal deviation present.  Cardiovascular: Normal rate.   Pulmonary/Chest: Effort normal. No respiratory distress.  Musculoskeletal: Normal range of motion.  Neurological: He is alert and oriented to person, place, and time.  Skin: Skin is warm and dry.  Psychiatric: His behavior is normal. His mood appears anxious. His speech is rapid and/or pressured. He is not actively hallucinating. Thought content is not paranoid. He expresses no homicidal and no suicidal ideation.    ED Course  Procedures (including critical care time) DIAGNOSTIC STUDIES: Oxygen Saturation is 97% on RA, normal by my interpretation.    COORDINATION OF CARE: 4:47 PM-Patient  informed of current plan of treatment and evaluation and agrees with plan.      Labs Review Labs Reviewed  COMPREHENSIVE METABOLIC PANEL - Abnormal; Notable for the following:    Chloride 94 (*)    Total Protein 8.4 (*)    All other components within normal limits  ETHANOL - Abnormal; Notable for the following:    Alcohol, Ethyl (B) 221 (*)    All other components within normal limits  SALICYLATE LEVEL - Abnormal; Notable for the following:    Salicylate Lvl <2.0 (*)    All other components within normal limits  ACETAMINOPHEN LEVEL  CBC  URINE RAPID DRUG SCREEN (HOSP PERFORMED)   Imaging Review No results found.  EKG Interpretation   None       MDM   1. Alcohol abuse    Patient having alcohol withdrawal symptoms Given 2 PO of Ativan and alcohol withdrawal and holding orders placed.   Elevated alcohol level. Otherwise patients labs are not acute. Will need to be closely monitored for alcohol   Patient moved to the back. Will have behavioral health interview patient.  I personally performed the services described in this documentation, which was scribed in my presence. The recorded information has been reviewed and is accurate.    Dorthula Matasiffany G Faysal Fenoglio, PA-C 07/28/13 1805  Dorthula Matasiffany G Katheleen Stella, PA-C 07/28/13 2033

## 2013-07-28 NOTE — Progress Notes (Signed)
Per Andrey CampanileSandy at [facility], pt has been accepted to [facility]. RN report [id number].  [facility] asked to be called when pt is being transported to [facility] by [employer] [facility].  Pt accepted on the contingency that [facility] will not transport for court date.  Per pt report, court date on 08/01/13 has been resolved and not an issue.  Blain PaisMichelle L Harlea Goetzinger, MHT/NS

## 2013-07-28 NOTE — ED Provider Notes (Signed)
Initially the plan was to transfer the patient to [facility] but plans have changed per psych team.  Pt is going to [facility] for treatment  Celene KrasJon R Mariela Rex, MD 07/28/13 2252

## 2013-07-28 NOTE — ED Notes (Signed)
Pt requesting detox from alcohol. Pt states that his last drink was around 2pm today. Pt normally drinks 12 beers a day then after dinner starts on liquor approx two 5th/day

## 2013-07-28 NOTE — Progress Notes (Signed)
Underwriter faxed referrals to the following facilities with bed availability:   1)[facility] 2)[facility] 3)[facility]  Maeva Dant L Charmain Diosdado, MHT/NS

## 2013-07-28 NOTE — BH Assessment (Signed)
Tele Assessment Note   Randall Beck is a 28 y.o. male who is self referred to [facility] ED for alcohol detox. Pt reported that he had been drinking since Thanksgiving, which consists of two 6-packs of beer and fifth of liquor daily.  He reports withdrawal symptoms of stomach cramps, shaking, changes in blood pressure, tingling and sweats.  Pt reported "I'm a binge drinker." Pt reports current stressor is breaking up with his girlfriend. Pt denies current and past SI and HI as well as attempts. Pt reports currently taking Lyrica and Ibuprofen. Pt reported taking medication for ADHD as a child but reported no current mental health treatment.  Pt denies depression but reports that he only sleeps and has appetite when he is drinking.  He reports symptoms of fatigue, isolation, and feeling of guilt.   Pt also reports occasional  marijuana use and last use was over a week ago. Pt reported longest period of sobriety was a year and a half in 2008.  Pt reports "several admissions" of inpatient history with last being at [facility] in April 2014.  Pt indicated he was at [facility] several times as well "a few years ago." Pt also reported [facility] stay [facility] when he was a teenager.  Pt reported family history of substance abuse with dad, uncle and paternal and maternal grandfathers.  Pt reported first age of alcohol use was at 28 y/o. He reports "my family makes white alcohol."    Pt reported a current DUI charge and court date on August 01, 2013.  Pt reports he is currently unemployed.  Pt identifies his mom and sister as supports.     Axis I: 303.90 Alcohol Dependence Axis II: Deferred 799.9 Axis III:  Past Medical History  Diagnosis Date  . Alcohol abuse     Father is strongly alcoholic with cirrhosis. Rehab stay in 06/2011  . Rotator cuff disorder   . Pancreatitis 06/2011  . Tobacco abuse    Axis IV: problems related to legal system/crime, problems related to social environment and problems  with primary support group Axis V: 40  Past Medical History:  Past Medical History  Diagnosis Date  . Alcohol abuse     Father is strongly alcoholic with cirrhosis. Rehab stay in 06/2011  . Rotator cuff disorder   . Pancreatitis 06/2011  . Tobacco abuse     Past Surgical History  Procedure Laterality Date  . No past surgeries    . Wisdom tooth extraction Right     Family History:  Family History  Problem Relation Age of Onset  . Alcohol abuse    . Cirrhosis    . Healthy      Social History:  reports that he has been smoking Cigarettes.  He has a 5 pack-year smoking history. He has quit using smokeless tobacco. He reports that he drinks alcohol. He reports that he does not use illicit drugs.  Additional Social History:  Alcohol / Drug Use Pain Medications: Denies Prescriptions: Denies Over the Counter: Denies History of alcohol / drug use?: Yes Longest period of sobriety (when/how long): 1 1/2 years in 2008 Negative Consequences of Use: Legal;Personal relationships Substance #1 Name of Substance 1: Alcohol 1 - Age of First Use: 12 1 - Amount (size/oz): 2- 6 packs of beer and 5th of liquor 1 - Frequency: daily 1 - Duration: a month and a half 1 - Last Use / Amount: 07/28/2013 Substance #2 Name of Substance 2: marijuana 2 - Age  of First Use: 12 2 - Amount (size/oz): unspecified 2 - Frequency: monthly 2 - Duration: unspecified 2 - Last Use / Amount: 07/20/13  CIWA: CIWA-Ar BP: 150/85 mmHg Pulse Rate: 110 Nausea and Vomiting: no nausea and no vomiting Tactile Disturbances: moderate itching, pins and needles, burning or numbness Tremor: moderate, with patient's arms extended Auditory Disturbances: not present Paroxysmal Sweats: two Visual Disturbances: not present Anxiety: three Headache, Fullness in Head: none present Agitation: two Orientation and Clouding of Sensorium: oriented and can do serial additions CIWA-Ar Total: 14 COWS:    Allergies: No Known  Allergies  Home Medications:  (Not in a hospital admission)  OB/GYN Status:  No LMP for male patient.  General Assessment Data Location of Assessment: [facility] Is this a Tele or Face-to-Face Assessment?: Tele Assessment Is this an Initial Assessment or a Re-assessment for this encounter?: Initial Assessment Living Arrangements: Other (Comment) (unknown) Can pt return to current living arrangement?:  (unknown) Admission Status: Voluntary Is patient capable of signing voluntary admission?: Yes Transfer from: Acute Hospital Referral Source: Other [facility])     [facility] Living Arrangements: Other (Comment) (unknown) Name of Psychiatrist: none Name of Therapist: none  Education Status Is patient currently in school?: No Contact person: Blanchard Kelcheggy Vanzandt- mother [phone]  Risk to self Suicidal Ideation: No Suicidal Intent: No Is patient at risk for suicide?: No Suicidal Plan?: No Access to Means: No What has been your use of drugs/alcohol within the last 12 months?: Alcohol and Cannabis Previous Attempts/Gestures: No How many times?: 0 Other Self Harm Risks: none Triggers for Past Attempts: Other (Comment) (NA) Intentional Self Injurious Behavior: None Family Suicide History: No Recent stressful life event(s): Conflict (Comment);Legal Issues (Relationship issues: broke up with girlfriend/ current DWI) Persecutory voices/beliefs?: No Depression: No Depression Symptoms: Isolating;Fatigue;Guilt;Insomnia Substance abuse history and/or treatment for substance abuse?: Yes (Alcohol and Cannabis) Suicide prevention information given to non-admitted patients: Not applicable  Risk to Others Homicidal Ideation: No Thoughts of Harm to Others: No Current Homicidal Intent: No Current Homicidal Plan: No Access to Homicidal Means: No Identified Victim: None History of harm to others?: No Assessment of Violence: None Noted Violent Behavior Description: Calm and Cooperate Does  patient have access to weapons?: Yes (Comment) (pocket knife) Criminal Charges Pending?: Yes Describe Pending Criminal Charges: DUI Does patient have a court date: Yes Court Date: 08/01/13  Psychosis Hallucinations: None noted Delusions: None noted  Mental Status Report Appear/Hygiene: Other (Comment) (in paper scrubs) Eye Contact: Good Motor Activity: Freedom of movement Speech: Logical/coherent Level of Consciousness: Alert Mood: Ashamed/humiliated Affect: Appropriate to circumstance Anxiety Level: None Thought Processes: Coherent;Relevant Judgement: Other (Comment) (Fair) Orientation: Person;Place;Time;Situation Obsessive Compulsive Thoughts/Behaviors: None  Cognitive Functioning Concentration: Normal Memory: Recent Intact;Remote Intact IQ: Average Insight: Fair Impulse Control: Fair Appetite: Fair Weight Loss: 0 Weight Gain: 0 Sleep: Decreased (Pt reports not sleeping when he doesn't drink) Total Hours of Sleep:  (unknown) Vegetative Symptoms: None  ADLScreening [facility] Assessment Services) Patient's cognitive ability adequate to safely complete daily activities?: Yes Patient able to express need for assistance with ADLs?: Yes Independently performs ADLs?: Yes (appropriate for developmental age)  Prior Inpatient Therapy Prior Inpatient Therapy: Yes Prior Therapy Dates: 10/2012, [facility], 30 day program;  Prior Therapy Facilty/Provider(s): [facility], last was a few years ago; inpatient Reason for Treatment: Pt reports attending [facility] as a teen  Prior Outpatient Therapy Prior Outpatient Therapy: No  ADL Screening (condition at time of admission) Patient's  cognitive ability adequate to safely complete daily activities?: Yes Is the patient deaf or have difficulty hearing?: No Does the patient have difficulty seeing, even when wearing glasses/contacts?: No Does the patient have difficulty concentrating, remembering, or  making decisions?: No Patient able to express need for assistance with ADLs?: Yes Does the patient have difficulty dressing or bathing?: Yes Independently performs ADLs?: Yes (appropriate for developmental age) Does the patient have difficulty walking or climbing stairs?: No Weakness of Legs: None Weakness of Arms/Hands: None  Home Assistive Devices/Equipment Home Assistive Devices/Equipment: None    Abuse/Neglect Assessment (Assessment to be complete while patient is alone) Physical Abuse: Denies Verbal Abuse: Denies Sexual Abuse: Denies Exploitation of patient/patient's resources: Denies Self-Neglect: Denies Values / Beliefs Cultural Requests During Hospitalization: None Spiritual Requests During Hospitalization: None   Advance Directives (For Healthcare) Advance Directive: Patient does not have advance directive (tele assessment: unable to provide) Nutrition Screen- [facility] Patient's home diet: Regular  Additional Information 1:1 In Past 12 Months?: No CIRT Risk: No Elopement Risk: No Does patient have medical clearance?: Yes     Disposition:  Disposition Initial Assessment Completed for this Encounter: Yes Disposition of Patient: Inpatient treatment program Type of inpatient treatment program: Adult After consulting with Alberteen Sam, NP it has been determined that Pt is not a life threatening danger to himself or others, but would benefit from detoxification from alcohol. Pt accepted to [facility] to the service of Geoffery Lyons, MD, room 301-2. At 21:11 I spoke to EDP Dr. Lynelle Doctor who concurs with decision to admit. Informed Elease Hashimoto, RN at 21:13 that Pt was accepted to [facility], however due to lack of insurance [facility] and [facility] will be contacted for bed availability.   Stewart,Meegan Shanafelt R, Triage Specialist 07/28/2013 8:48 PM

## 2013-07-30 ENCOUNTER — Emergency Department: Payer: Self-pay | Admitting: Emergency Medicine

## 2013-07-30 LAB — SALICYLATE LEVEL: Salicylates, Serum: <1.7 mg/dL

## 2013-07-30 LAB — URINALYSIS, COMPLETE
BACTERIA: NONE SEEN
BILIRUBIN, UR: NEGATIVE
BLOOD: NEGATIVE
GLUCOSE, UR: NEGATIVE mg/dL (ref 0–75)
Ketone: NEGATIVE
LEUKOCYTE ESTERASE: NEGATIVE
NITRITE: NEGATIVE
Ph: 7 (ref 4.5–8.0)
Protein: NEGATIVE
RBC,UR: <1 /HPF (ref 0–5)
SPECIFIC GRAVITY: 1.005 (ref 1.003–1.030)
Squamous Epithelial: NONE SEEN
WBC UR: <1 /HPF (ref 0–5)

## 2013-07-30 LAB — COMPREHENSIVE METABOLIC PANEL
ALK PHOS: 57 U/L
ALT: 23 U/L (ref 12–78)
ANION GAP: 5 — AB (ref 7–16)
Albumin: 3.7 g/dL (ref 3.4–5.0)
BILIRUBIN TOTAL: 0.4 mg/dL (ref 0.2–1.0)
BUN: 8 mg/dL (ref 7–18)
CALCIUM: 8.6 mg/dL (ref 8.5–10.1)
CHLORIDE: 107 mmol/L (ref 98–107)
CO2: 27 mmol/L (ref 21–32)
Creatinine: 0.88 mg/dL (ref 0.60–1.30)
EGFR (African American): >60
Glucose: 94 mg/dL (ref 65–99)
OSMOLALITY: 276 (ref 275–301)
POTASSIUM: 3.8 mmol/L (ref 3.5–5.1)
SGOT(AST): 32 U/L (ref 15–37)
SODIUM: 139 mmol/L (ref 136–145)
Total Protein: 7.2 g/dL (ref 6.4–8.2)

## 2013-07-30 LAB — TSH: THYROID STIMULATING HORM: 2.91 u[IU]/mL

## 2013-07-30 LAB — CBC
HCT: 42.4 % (ref 40.0–52.0)
HGB: 14.6 g/dL (ref 13.0–18.0)
MCH: 32.5 pg (ref 26.0–34.0)
MCHC: 34.4 g/dL (ref 32.0–36.0)
MCV: 94 fL (ref 80–100)
PLATELETS: 134 10*3/uL — AB (ref 150–440)
RBC: 4.49 10*6/uL (ref 4.40–5.90)
RDW: 13.7 % (ref 11.5–14.5)
WBC: 4.9 10*3/uL (ref 3.8–10.6)

## 2013-07-30 LAB — DRUG SCREEN, URINE

## 2013-07-30 LAB — LIPASE, BLOOD: Lipase: 262 U/L (ref 73–393)

## 2013-07-30 LAB — ETHANOL
Ethanol %: <0.003 % (ref 0.000–0.080)
Ethanol: <3 mg/dL

## 2013-07-30 LAB — ACETAMINOPHEN LEVEL: Acetaminophen: <2 ug/mL

## 2013-08-07 NOTE — ED Provider Notes (Signed)
Medical screening examination/treatment/procedure(s) were performed by non-physician practitioner and as supervising physician I was immediately available for consultation/collaboration.  EKG Interpretation   None         Saket Hellstrom, MD 08/07/13 0755 

## 2013-08-26 ENCOUNTER — Emergency Department (HOSPITAL_COMMUNITY)
Admission: EM | Admit: 2013-08-26 | Discharge: 2013-08-26 | Disposition: A | Discharge status: Home / Self Care | Payer: Self-pay | Attending: Emergency Medicine | Admitting: Emergency Medicine

## 2013-08-26 ENCOUNTER — Encounter (HOSPITAL_COMMUNITY): Payer: Self-pay | Admitting: Emergency Medicine

## 2013-08-26 DIAGNOSIS — F1010 Alcohol abuse, uncomplicated: Secondary | ICD-10-CM | POA: Insufficient documentation

## 2013-08-26 DIAGNOSIS — Y929 Unspecified place or not applicable: Secondary | ICD-10-CM | POA: Insufficient documentation

## 2013-08-26 DIAGNOSIS — Z8739 Personal history of other diseases of the musculoskeletal system and connective tissue: Secondary | ICD-10-CM | POA: Insufficient documentation

## 2013-08-26 DIAGNOSIS — K089 Disorder of teeth and supporting structures, unspecified: Secondary | ICD-10-CM | POA: Insufficient documentation

## 2013-08-26 DIAGNOSIS — K0889 Other specified disorders of teeth and supporting structures: Secondary | ICD-10-CM

## 2013-08-26 DIAGNOSIS — S61209A Unspecified open wound of unspecified finger without damage to nail, initial encounter: Secondary | ICD-10-CM | POA: Insufficient documentation

## 2013-08-26 DIAGNOSIS — W261XXA Contact with sword or dagger, initial encounter: Secondary | ICD-10-CM

## 2013-08-26 DIAGNOSIS — Z8719 Personal history of other diseases of the digestive system: Secondary | ICD-10-CM | POA: Insufficient documentation

## 2013-08-26 DIAGNOSIS — W260XXA Contact with knife, initial encounter: Secondary | ICD-10-CM | POA: Insufficient documentation

## 2013-08-26 DIAGNOSIS — S61219A Laceration without foreign body of unspecified finger without damage to nail, initial encounter: Secondary | ICD-10-CM

## 2013-08-26 DIAGNOSIS — F17200 Nicotine dependence, unspecified, uncomplicated: Secondary | ICD-10-CM | POA: Insufficient documentation

## 2013-08-26 DIAGNOSIS — Y93G9 Activity, other involving cooking and grilling: Secondary | ICD-10-CM | POA: Insufficient documentation

## 2013-08-26 NOTE — ED Notes (Signed)
Pt has a laceration to his left ring finger, bleeding is controlled.  Pt rates pain 4/10.  Pt is A&O x3.

## 2013-08-26 NOTE — Discharge Instructions (Signed)
Suture removal in 7-10 days Call dentist at the included number for appointment.  Laceration Care, Adult A laceration is a cut or lesion that goes through all layers of the skin and into the tissue just beneath the skin. TREATMENT  Some lacerations may not require closure. Some lacerations may not be able to be closed due to an increased risk of infection. It is important to see your caregiver as soon as possible after an injury to minimize the risk of infection and maximize the opportunity for successful closure. If closure is appropriate, pain medicines may be given, if needed. The wound will be cleaned to help prevent infection. Your caregiver will use stitches (sutures), staples, wound glue (adhesive), or skin adhesive strips to repair the laceration. These tools bring the skin edges together to allow for faster healing and a better cosmetic outcome. However, all wounds will heal with a scar. Once the wound has healed, scarring can be minimized by covering the wound with sunscreen during the day for 1 full year. HOME CARE INSTRUCTIONS  For sutures or staples:  Keep the wound clean and dry.  If you were given a bandage (dressing), you should change it at least once a day. Also, change the dressing if it becomes wet or dirty, or as directed by your caregiver.  Wash the wound with soap and water 2 times a day. Rinse the wound off with water to remove all soap. Pat the wound dry with a clean towel.  After cleaning, apply a thin layer of the antibiotic ointment as recommended by your caregiver. This will help prevent infection and keep the dressing from sticking.  You may shower as usual after the first 24 hours. Do not soak the wound in water until the sutures are removed.  Only take over-the-counter or prescription medicines for pain, discomfort, or fever as directed by your caregiver.  Get your sutures or staples removed as directed by your caregiver. For skin adhesive strips:  Keep the  wound clean and dry.  Do not get the skin adhesive strips wet. You may bathe carefully, using caution to keep the wound dry.  If the wound gets wet, pat it dry with a clean towel.  Skin adhesive strips will fall off on their own. You may trim the strips as the wound heals. Do not remove skin adhesive strips that are still stuck to the wound. They will fall off in time. For wound adhesive:  You may briefly wet your wound in the shower or bath. Do not soak or scrub the wound. Do not swim. Avoid periods of heavy perspiration until the skin adhesive has fallen off on its own. After showering or bathing, gently pat the wound dry with a clean towel.  Do not apply liquid medicine, cream medicine, or ointment medicine to your wound while the skin adhesive is in place. This may loosen the film before your wound is healed.  If a dressing is placed over the wound, be careful not to apply tape directly over the skin adhesive. This may cause the adhesive to be pulled off before the wound is healed.  Avoid prolonged exposure to sunlight or tanning lamps while the skin adhesive is in place. Exposure to ultraviolet light in the first year will darken the scar.  The skin adhesive will usually remain in place for 5 to 10 days, then naturally fall off the skin. Do not pick at the adhesive film. You may need a tetanus shot if:  You cannot remember  when you had your last tetanus shot.  You have never had a tetanus shot. If you get a tetanus shot, your arm may swell, get red, and feel warm to the touch. This is common and not a problem. If you need a tetanus shot and you choose not to have one, there is a rare chance of getting tetanus. Sickness from tetanus can be serious. SEEK MEDICAL CARE IF:   You have redness, swelling, or increasing pain in the wound.  You see a red line that goes away from the wound.  You have yellowish-white fluid (pus) coming from the wound.  You have a fever.  You notice a bad  smell coming from the wound or dressing.  Your wound breaks open before or after sutures have been removed.  You notice something coming out of the wound such as wood or glass.  Your wound is on your hand or foot and you cannot move a finger or toe. SEEK IMMEDIATE MEDICAL CARE IF:   Your pain is not controlled with prescribed medicine.  You have severe swelling around the wound causing pain and numbness or a change in color in your arm, hand, leg, or foot.  Your wound splits open and starts bleeding.  You have worsening numbness, weakness, or loss of function of any joint around or beyond the wound.  You develop painful lumps near the wound or on the skin anywhere on your body. MAKE SURE YOU:   Understand these instructions.  Will watch your condition.  Will get help right away if you are not doing well or get worse. Document Released: 07/13/2005 Document Revised: 10/05/2011 Document Reviewed: 01/06/2011 [facility] Patient Information 2014 [place], [place].  Dental Pain A tooth ache may be caused by cavities (tooth decay). Cavities expose the nerve of the tooth to air and hot or cold temperatures. It may come from an infection or abscess (also called a boil or furuncle) around your tooth. It is also often caused by dental caries (tooth decay). This causes the pain you are having. DIAGNOSIS  Your caregiver can diagnose this problem by exam. TREATMENT   If caused by an infection, it may be treated with medications which kill germs (antibiotics) and pain medications as prescribed by your caregiver. Take medications as directed.  Only take over-the-counter or prescription medicines for pain, discomfort, or fever as directed by your caregiver.  Whether the tooth ache today is caused by infection or dental disease, you should see your dentist as soon as possible for further care. SEEK MEDICAL CARE IF: The exam and treatment you received today has been provided on an emergency basis  only. This is not a substitute for complete medical or dental care. If your problem worsens or new problems (symptoms) appear, and you are unable to meet with your dentist, call or return to this location. SEEK IMMEDIATE MEDICAL CARE IF:   You have a fever.  You develop redness and swelling of your face, jaw, or neck.  You are unable to open your mouth.  You have severe pain uncontrolled by pain medicine. MAKE SURE YOU:   Understand these instructions.  Will watch your condition.  Will get help right away if you are not doing well or get worse. Document Released: 07/13/2005 Document Revised: 10/05/2011 Document Reviewed: 02/29/2008 [facility] Patient Information 2014 [place], [place].

## 2013-08-26 NOTE — ED Provider Notes (Signed)
CSN: [id number]     Arrival date & time 08/26/13  2110 History   First MD Initiated Contact with Patient 08/26/13 2130     Chief Complaint  Patient presents with  . Extremity Laceration   HPI  Patient was intoxicated and cutting vegetables. He has a laceration to his left fourth digit. At approximately 1 hour ago. States he was drinking because he has a painful tooth.  Past Medical History  Diagnosis Date  . Alcohol abuse     Father is strongly alcoholic with cirrhosis. Rehab stay in 06/2011  . Rotator cuff disorder   . Pancreatitis 06/2011  . Tobacco abuse    Past Surgical History  Procedure Laterality Date  . No past surgeries    . Wisdom tooth extraction Right    Family History  Problem Relation Age of Onset  . Alcohol abuse    . Cirrhosis    . Healthy     History  Substance Use Topics  . Smoking status: Current Every Day Smoker -- 0.50 packs/day for 10 years    Types: Cigarettes  . Smokeless tobacco: Former NeurosurgeonUser  . Alcohol Use: 0.0 oz/week    Review of Systems  Constitutional: Negative for fever, chills, diaphoresis, appetite change and fatigue.  HENT: Positive for dental problem. Negative for mouth sores, sore throat and trouble swallowing.   Eyes: Negative for visual disturbance.  Respiratory: Negative for cough, chest tightness, shortness of breath and wheezing.   Cardiovascular: Negative for chest pain.  Gastrointestinal: Negative for nausea, vomiting, abdominal pain, diarrhea and abdominal distention.  Endocrine: Negative for polydipsia, polyphagia and polyuria.  Genitourinary: Negative for dysuria, frequency and hematuria.  Musculoskeletal: Negative for gait problem.  Skin: Negative for color change, pallor and rash.       Left fourth digit laceration  Neurological: Negative for dizziness, syncope, light-headedness and headaches.  Hematological: Does not bruise/bleed easily.  Psychiatric/Behavioral: Negative for behavioral problems and confusion.     Allergies  Review of patient's allergies indicates no known allergies.  Home Medications  No current outpatient prescriptions on file. BP 134/77  Pulse 112  Temp(Src) 98.2 F (36.8 C) (Oral)  Resp 24  Ht 5\' 11"  (1.803 m)  Wt 175 lb (79.379 kg)  BMI 24.42 kg/m2  SpO2 95% Physical Exam  HENT:  Mouth/Throat:    Skin:  1 severe laceration to the soft tissues of the left fourth digit P3 volar. Proximal based.    ED Course  LACERATION REPAIR Date/Time: 08/26/2013 9:48 PM Performed by: Rolland PorterJAMES, Shweta Aman Authorized by: Rolland PorterJAMES, Annelies Coyt Consent: Verbal consent obtained. written consent not obtained. Consent given by: patient Body area: upper extremity Location details: left ring finger Laceration length: 1 cm Tendon involvement: none Nerve involvement: none Vascular damage: no Anesthesia: digital block Local anesthetic: lidocaine 1% without epinephrine Anesthetic total: 4 ml Patient sedated: no Irrigation solution: saline Amount of cleaning: standard Debridement: none Degree of undermining: none Skin closure: 4-0 nylon Number of sutures: 3 Technique: simple Approximation: close Approximation difficulty: simple Dressing: tube gauze Patient tolerance: Patient tolerated the procedure well with no immediate complications.   (including critical care time) Labs Review Labs Reviewed - No data to display Imaging Review No results found.  EKG Interpretation   None       MDM   1. Finger laceration   2. Pain, dental        Rolland PorterMark Sir Mallis, MD 08/26/13 2208

## 2013-08-26 NOTE — ED Notes (Signed)
Pt arrived with a need for a laceration fix.  Pt was chopping vegetables for dinner when he cut his finger with the chopping knife.  Pt cut left ring finger on the tip.  Laceration is 1-1.5 cm long

## 2013-10-05 ENCOUNTER — Encounter (HOSPITAL_COMMUNITY): Payer: Self-pay | Admitting: Emergency Medicine

## 2013-10-05 ENCOUNTER — Emergency Department (HOSPITAL_COMMUNITY)
Admission: EM | Admit: 2013-10-05 | Discharge: 2013-10-05 | Disposition: A | Discharge status: Home / Self Care | Payer: Self-pay | Attending: Emergency Medicine | Admitting: Emergency Medicine

## 2013-10-05 DIAGNOSIS — Z8739 Personal history of other diseases of the musculoskeletal system and connective tissue: Secondary | ICD-10-CM | POA: Insufficient documentation

## 2013-10-05 DIAGNOSIS — K0889 Other specified disorders of teeth and supporting structures: Secondary | ICD-10-CM

## 2013-10-05 DIAGNOSIS — K0381 Cracked tooth: Secondary | ICD-10-CM | POA: Insufficient documentation

## 2013-10-05 DIAGNOSIS — F17200 Nicotine dependence, unspecified, uncomplicated: Secondary | ICD-10-CM | POA: Insufficient documentation

## 2013-10-05 DIAGNOSIS — Z8719 Personal history of other diseases of the digestive system: Secondary | ICD-10-CM | POA: Insufficient documentation

## 2013-10-05 DIAGNOSIS — K029 Dental caries, unspecified: Secondary | ICD-10-CM | POA: Insufficient documentation

## 2013-10-05 DIAGNOSIS — K089 Disorder of teeth and supporting structures, unspecified: Secondary | ICD-10-CM | POA: Insufficient documentation

## 2013-10-05 MED ORDER — AMOXICILLIN 500 MG PO CAPS: 500.0000 mg | ORAL_CAPSULE | Freq: Three times a day (TID) | ORAL | Status: DC

## 2013-10-05 MED ORDER — IBUPROFEN 800 MG PO TABS: 800.0000 mg | ORAL_TABLET | Freq: Three times a day (TID) | ORAL | Status: DC

## 2013-10-05 MED ORDER — HYDROCODONE-ACETAMINOPHEN 5-325 MG PO TABS: 1.0000 | ORAL_TABLET | ORAL | Status: DC | PRN

## 2013-10-05 NOTE — ED Notes (Signed)
PT driving unable to take narcotics and reports having already had Ibuprofen and ready for d/c.

## 2013-10-05 NOTE — Discharge Instructions (Signed)
Continue to take amoxicillin for infection. Ibuprofen and norco for pain. Follow up with a dentist.    Dental Pain A tooth ache may be caused by cavities (tooth decay). Cavities expose the nerve of the tooth to air and hot or cold temperatures. It may come from an infection or abscess (also called a boil or furuncle) around your tooth. It is also often caused by dental caries (tooth decay). This causes the pain you are having. DIAGNOSIS  Your caregiver can diagnose this problem by exam. TREATMENT   If caused by an infection, it may be treated with medications which kill germs (antibiotics) and pain medications as prescribed by your caregiver. Take medications as directed.  Only take over-the-counter or prescription medicines for pain, discomfort, or fever as directed by your caregiver.  Whether the tooth ache today is caused by infection or dental disease, you should see your dentist as soon as possible for further care. SEEK MEDICAL CARE IF: The exam and treatment you received today has been provided on an emergency basis only. This is not a substitute for complete medical or dental care. If your problem worsens or new problems (symptoms) appear, and you are unable to meet with your dentist, call or return to this location. SEEK IMMEDIATE MEDICAL CARE IF:   You have a fever.  You develop redness and swelling of your face, jaw, or neck.  You are unable to open your mouth.  You have severe pain uncontrolled by pain medicine. MAKE SURE YOU:   Understand these instructions.  Will watch your condition.  Will get help right away if you are not doing well or get worse. Document Released: 07/13/2005 Document Revised: 10/05/2011 Document Reviewed: 02/29/2008 [facility] Patient Information 2014 [place], [place].

## 2013-10-05 NOTE — ED Notes (Signed)
Pt reports upper left tooth pain at 8/10. Wanting to see a dentist and asking for a referral.

## 2013-10-05 NOTE — Progress Notes (Signed)
[facility] provided pt with a list of primary care resources and dental resources to help patient establish primary care.  °

## 2013-10-05 NOTE — ED Provider Notes (Signed)
CSN: [id number]     Arrival date & time 10/05/13  1148 History  This chart was scribed for non-physician practitioner working with Junius ArgyleForrest S Harrison, MD by Ashley JacobsBrittany Andrews, ED scribe. This patient was seen in room WTR9/WTR9 and the patient's care was started at 12:38 PM.  First MD Initiated Contact with Patient 10/05/13 1157     No chief complaint on file.    (Consider location/radiation/quality/duration/timing/severity/associated sxs/prior Treatment) The history is provided by the patient and medical records. No language interpreter was used.   HPI Comments: Randall Beck is a 28 y.o. male who presents to the Emergency Department complaining of progressively worsening moderate dental pain for the past five months. Pt was eating at [employer] when he fractured his upper left tooth while eating. Pt reports the pain is 8/10 in severity. He states that he had to leave work early due to pain. Pt mentions trying Amoxicillin from his girlfriend's old prescription. The pain is worse with eating and drinking. Nothing seems to resolve the pain. He is requesting a referral to see a dentist.  Past Medical History  Diagnosis Date  . Alcohol abuse     Father is strongly alcoholic with cirrhosis. Rehab stay in 06/2011  . Rotator cuff disorder   . Pancreatitis 06/2011  . Tobacco abuse    Past Surgical History  Procedure Laterality Date  . No past surgeries    . Wisdom tooth extraction Right    Family History  Problem Relation Age of Onset  . Alcohol abuse    . Cirrhosis    . Healthy     History  Substance Use Topics  . Smoking status: Current Every Day Smoker -- 0.50 packs/day for 10 years    Types: Cigarettes  . Smokeless tobacco: Former NeurosurgeonUser  . Alcohol Use: 0.0 oz/week    Review of Systems  HENT: Positive for dental problem.   All other systems reviewed and are negative.      Allergies  Review of patient's allergies indicates no known allergies.  Home Medications   Current  Outpatient Rx  Name  Route  Sig  Dispense  Refill  . ibuprofen (ADVIL,MOTRIN) 200 MG tablet   Oral   Take 800 mg by mouth every 6 (six) hours as needed for moderate pain.          BP 142/89  Pulse 81  Temp(Src) 98.2 F (36.8 C) (Oral)  Resp 16  SpO2 100% Physical Exam  Nursing note and vitals reviewed. Constitutional: He is oriented to person, place, and time. He appears well-developed and well-nourished. No distress.  HENT:  Head: Normocephalic and atraumatic.  Mouth/Throat: Uvula is midline, oropharynx is clear and moist and mucous membranes are normal. Dental caries present.  Cavity to the upper second molar No surrounding edema or erythema No swelling under the tongue or erythema No facial swelling  Eyes: EOM are normal. Pupils are equal, round, and reactive to light.  Neck: Normal range of motion. Neck supple. No tracheal deviation present.  Cardiovascular: Normal rate.   Pulmonary/Chest: Effort normal. No respiratory distress.  Abdominal: Soft. He exhibits no distension.  Musculoskeletal: Normal range of motion.  Neurological: He is alert and oriented to person, place, and time.  Skin: Skin is warm and dry.  Psychiatric: He has a normal mood and affect. His behavior is normal.    ED Course  Procedures (including critical care time) DIAGNOSTIC STUDIES: Oxygen Saturation is 100% on room air, normal by my interpretation.  COORDINATION OF CARE:  12:42 PM Discussed course of care with pt . Pt understands and agrees.   Labs Review Labs Reviewed - No data to display Imaging Review No results found.   EKG Interpretation None      MDM   Final diagnoses:  Pain, dental    Patients with a dental pain, requesting a dental referral. No obvious signs of an abscess. Will place on amoxicillin prophylactically, ibuprofen and Norco for pain, referral to a dentist provided.  Filed Vitals:   10/05/13 1206  BP: 142/89  Pulse: 81  Temp: 98.2 F (36.8 C)  TempSrc:  Oral  Resp: 16  SpO2: 100%    I personally performed the services described in this documentation, which was scribed in my presence. The recorded information has been reviewed and is accurate.     Lottie Mussel, PA-C 10/05/13 1428

## 2013-10-05 NOTE — ED Provider Notes (Signed)
Medical screening examination/treatment/procedure(s) were performed by non-physician practitioner and as supervising physician I was immediately available for consultation/collaboration.   EKG Interpretation None        Junius ArgyleForrest S Sarayah Bacchi, MD 10/05/13 1536

## 2016-01-16 ENCOUNTER — Emergency Department (HOSPITAL_BASED_OUTPATIENT_CLINIC_OR_DEPARTMENT_OTHER)
Admission: EM | Admit: 2016-01-16 | Discharge: 2016-01-16 | Disposition: A | Discharge status: Home / Self Care | Payer: Self-pay | Attending: Emergency Medicine | Admitting: Emergency Medicine

## 2016-01-16 ENCOUNTER — Encounter (HOSPITAL_BASED_OUTPATIENT_CLINIC_OR_DEPARTMENT_OTHER): Payer: Self-pay

## 2016-01-16 DIAGNOSIS — F17210 Nicotine dependence, cigarettes, uncomplicated: Secondary | ICD-10-CM | POA: Insufficient documentation

## 2016-01-16 DIAGNOSIS — R1013 Epigastric pain: Secondary | ICD-10-CM | POA: Insufficient documentation

## 2016-01-16 DIAGNOSIS — R109 Unspecified abdominal pain: Secondary | ICD-10-CM

## 2016-01-16 DIAGNOSIS — R112 Nausea with vomiting, unspecified: Secondary | ICD-10-CM | POA: Insufficient documentation

## 2016-01-16 HISTORY — DX: Unspecified viral hepatitis C without hepatic coma: B19.20

## 2016-01-16 LAB — CBC WITH DIFFERENTIAL/PLATELET
BASOS ABS: 0 10*3/uL (ref 0.0–0.1)
Basophils Relative: 0 %
EOS ABS: 0.1 10*3/uL (ref 0.0–0.7)
Eosinophils Relative: 1 %
HCT: 46.9 % (ref 39.0–52.0)
HEMOGLOBIN: 16.1 g/dL (ref 13.0–17.0)
LYMPHS ABS: 2.8 10*3/uL (ref 0.7–4.0)
LYMPHS PCT: 35 %
MCH: 32 pg (ref 26.0–34.0)
MCHC: 34.3 g/dL (ref 30.0–36.0)
MCV: 93.2 fL (ref 78.0–100.0)
Monocytes Absolute: 1 10*3/uL (ref 0.1–1.0)
Monocytes Relative: 12 %
NEUTROS PCT: 52 %
Neutro Abs: 4 10*3/uL (ref 1.7–7.7)
Platelets: 209 10*3/uL (ref 150–400)
RBC: 5.03 MIL/uL (ref 4.22–5.81)
RDW: 13.2 % (ref 11.5–15.5)
WBC: 8 10*3/uL (ref 4.0–10.5)

## 2016-01-16 LAB — COMPREHENSIVE METABOLIC PANEL
ALK PHOS: 49 U/L (ref 38–126)
ALT: 134 U/L — AB (ref 17–63)
AST: 67 U/L — AB (ref 15–41)
Albumin: 4.2 g/dL (ref 3.5–5.0)
Anion gap: 9 (ref 5–15)
BUN: 16 mg/dL (ref 6–20)
CALCIUM: 9.2 mg/dL (ref 8.9–10.3)
CO2: 26 mmol/L (ref 22–32)
CREATININE: 0.95 mg/dL (ref 0.61–1.24)
Chloride: 103 mmol/L (ref 101–111)
GFR calc non Af Amer: >60 mL/min (ref >60)
Glucose, Bld: 97 mg/dL (ref 65–99)
Potassium: 3.8 mmol/L (ref 3.5–5.1)
SODIUM: 138 mmol/L (ref 135–145)
Total Bilirubin: 0.5 mg/dL (ref 0.3–1.2)
Total Protein: 7.1 g/dL (ref 6.5–8.1)

## 2016-01-16 LAB — LIPASE, BLOOD: Lipase: 21 U/L (ref 11–51)

## 2016-01-16 LAB — ETHANOL

## 2016-01-16 MED ORDER — HYDROMORPHONE HCL 1 MG/ML IJ SOLN
1.0000 mg | Freq: Once | INTRAMUSCULAR | Status: AC
  Administered 2016-01-16: 1 mg via INTRAVENOUS
  Filled 2016-01-16: qty 1

## 2016-01-16 MED ORDER — SODIUM CHLORIDE 0.9 % IV BOLUS (SEPSIS)
1000.0000 mL | Freq: Once | INTRAVENOUS | Status: AC
  Administered 2016-01-16: 1000 mL via INTRAVENOUS

## 2016-01-16 MED ORDER — ONDANSETRON HCL 4 MG/2ML IJ SOLN
4.0000 mg | Freq: Once | INTRAMUSCULAR | Status: AC
  Administered 2016-01-16: 4 mg via INTRAVENOUS
  Filled 2016-01-16: qty 2

## 2016-01-16 MED ORDER — MORPHINE SULFATE (PF) 4 MG/ML IV SOLN
4.0000 mg | Freq: Once | INTRAVENOUS | Status: AC
  Administered 2016-01-16: 4 mg via INTRAVENOUS
  Filled 2016-01-16: qty 1

## 2016-01-16 NOTE — Discharge Instructions (Signed)
May take over the counter medications as needed for pain.  Recommend to try to avoid tylenol due to your hepatitis. Follow-up GI doctor for loose monitoring of your hepatitis-- call to make appt. If you do not have a primary care doctor or insurance, you may follow-up at [facility] for free routine medical care. Return to the ED for new or worsening symptoms.

## 2016-01-16 NOTE — ED Notes (Signed)
abd pain, n/v x today-hx of pancreatitis-no ETOH x 92 days

## 2016-01-16 NOTE — ED Provider Notes (Signed)
CSN: [id number]     Arrival date & time 01/16/16  1829 History   First MD Initiated Contact with Patient 01/16/16 1839     Chief Complaint  Patient presents with  . Abdominal Pain     (Consider location/radiation/quality/duration/timing/severity/associated sxs/prior Treatment) Patient is a 30 y.o. male presenting with abdominal pain. The history is provided by the patient and medical records.  Abdominal Pain Associated symptoms: nausea and vomiting     30 year old male with history of alcohol abuse, pancreatitis, tobacco abuse, presenting to the ED for abdominal pain. He states this began around 10:30 AM. Initially was a mild discomfort but it has progressively worsened throughout the day today. He reports associated nausea and vomiting. He denies any fever or chills. No diarrhea. Pain worse in his epigastrium with radiation to his back. States this feels like his prior episodes of pancreatitis. He has history of alcohol abuse but has been sober for 92 days. He is currently in [place]. He is status post cholecystectomy. No other abdominal surgeries. States he took some Tylenol this morning without relief. VS stable.  Past Medical History  Diagnosis Date  . Alcohol abuse     Father is strongly alcoholic with cirrhosis. Rehab stay in 06/2011  . Rotator cuff disorder   . Pancreatitis 06/2011  . Tobacco abuse    Past Surgical History  Procedure Laterality Date  . No past surgeries    . Wisdom tooth extraction Right   . Cholecystectomy     Family History  Problem Relation Age of Onset  . Alcohol abuse    . Cirrhosis    . Healthy     Social History  Substance Use Topics  . Smoking status: Current Every Day Smoker -- 0.50 packs/day for 10 years    Types: Cigarettes  . Smokeless tobacco: Former NeurosurgeonUser  . Alcohol Use: No    Review of Systems  Gastrointestinal: Positive for nausea, vomiting and abdominal pain.  All other systems reviewed and are negative.     Allergies  Review of  patient's allergies indicates no known allergies.  Home Medications   Prior to Admission medications   Not on File   BP 158/85 mmHg  Pulse 78  Temp(Src) 99.6 F (37.6 C) (Oral)  Resp 20  Ht 5\' 11"  (1.803 m)  Wt 78.019 kg  BMI 24.00 kg/m2  SpO2 99%   Physical Exam  Constitutional: He is oriented to person, place, and time. He appears well-developed and well-nourished.  Writhing in bed, appears uncomfortable  HENT:  Head: Normocephalic and atraumatic.  Mouth/Throat: Oropharynx is clear and moist.  Eyes: Conjunctivae and EOM are normal. Pupils are equal, round, and reactive to light.  Neck: Normal range of motion.  Cardiovascular: Normal rate, regular rhythm and normal heart sounds.   Pulmonary/Chest: Effort normal and breath sounds normal.  Abdominal: Soft. Bowel sounds are normal. There is tenderness in the epigastric area. There is no rigidity, no guarding and no CVA tenderness.  Abdomen soft, non-distended, TTP in epigastrium Normal bowel sounds, no CVA tenderness  Musculoskeletal: Normal range of motion.  Neurological: He is alert and oriented to person, place, and time.  Skin: Skin is warm and dry.  Psychiatric: He has a normal mood and affect.  Nursing note and vitals reviewed.   ED Course  Procedures (including critical care time) Labs Review Labs Reviewed  COMPREHENSIVE METABOLIC PANEL - Abnormal; Notable for the following:    AST 67 (*)    ALT 134 (*)  All other components within normal limits  CBC WITH DIFFERENTIAL/PLATELET  LIPASE, BLOOD  ETHANOL    Imaging Review No results found. I have personally reviewed and evaluated these images and lab results as part of my medical decision-making.   EKG Interpretation None      MDM   Final diagnoses:  Abdominal pain, unspecified abdominal location   30 y.o. M here with epigastric abdominal pain.  Hx of pancreatitis.  Patient afebrile, non-toxic.  He is writhing in bed, appears uncomfortable.  TTP in  epigastrium without peritonitis.  Labs reassuring aside from mildly elevated LFT's. Patient does have chronic hx of Hep C, previously followed by GI for this but no recent follow-up.  Denies excessive tylenol intake exceeding 2g daily.  Ethanol WNL.  Bili and lipase WNL.  Patient treated with morphine and dilaudid here with improvement.  No active nausea/vomiting here.  Mother arrived, will drive him home.  No current GI physician, will refer to physician on call.  Recommended to avoid tylenol given his elevated LFT's.  Continue with AA. [facility]erred [facility] as patient does not have any current insurance or PCP.  Discussed plan with patient, he/she acknowledged understanding and agreed with plan of care.  Return precautions given for new or worsening symptoms.  Garlon HatchetLisa M Sanders, PA-C 01/16/16 2239  Melene Planan Floyd, DO 01/16/16 2259

## 2016-01-16 NOTE — ED Notes (Signed)
Pt placed on auto vitals Q30.  

## 2016-01-16 NOTE — ED Notes (Signed)
PA at bedside at this time.  

## 2020-12-25 DEATH — deceased
